# Patient Record
Sex: Male | Born: 1960 | Race: White | Hispanic: No | Marital: Married
Health system: Midwestern US, Community
[De-identification: ages and names within clinical notes are randomized; demographics above are authoritative.]

## PROBLEM LIST (undated history)

## (undated) DIAGNOSIS — Z9989 Dependence on other enabling machines and devices: Secondary | ICD-10-CM

## (undated) DIAGNOSIS — G43909 Migraine, unspecified, not intractable, without status migrainosus: Secondary | ICD-10-CM

## (undated) DIAGNOSIS — R42 Dizziness and giddiness: Secondary | ICD-10-CM

## (undated) DIAGNOSIS — E785 Hyperlipidemia, unspecified: Secondary | ICD-10-CM

## (undated) DIAGNOSIS — G4733 Obstructive sleep apnea (adult) (pediatric): Secondary | ICD-10-CM

## (undated) HISTORY — DX: Dizziness and giddiness: R42

## (undated) HISTORY — PX: ROTATOR CUFF REPAIR: SHX139

## (undated) HISTORY — DX: Obstructive sleep apnea (adult) (pediatric): G47.33

## (undated) HISTORY — DX: Migraine, unspecified, not intractable, without status migrainosus: G43.909

## (undated) HISTORY — PX: WRIST FRACTURE SURGERY: SHX121

## (undated) HISTORY — PX: NASAL SEPTUM SURGERY: SHX37

## (undated) HISTORY — DX: Obstructive sleep apnea (adult) (pediatric): Z99.89

## (undated) HISTORY — DX: Hyperlipidemia, unspecified: E78.5

---

## 1999-02-11 ENCOUNTER — Emergency Department (HOSPITAL_COMMUNITY): Admission: EM | Admit: 1999-02-11 | Discharge: 1999-02-11 | Payer: Self-pay | Admitting: Emergency Medicine

## 2011-02-27 ENCOUNTER — Encounter (HOSPITAL_COMMUNITY)
Admission: RE | Admit: 2011-02-27 | Discharge: 2011-02-27 | Disposition: A | Discharge status: Home / Self Care | Payer: BC Managed Care – PPO | Source: Ambulatory Visit | Attending: Orthopedic Surgery | Admitting: Orthopedic Surgery

## 2011-02-27 LAB — COMPREHENSIVE METABOLIC PANEL
ALT: 31 U/L (ref 0–53)
Alkaline Phosphatase: 72 U/L (ref 39–117)
GFR calc Af Amer: >90 mL/min (ref >90)
Glucose, Bld: 78 mg/dL (ref 70–99)
Potassium: 4.9 mEq/L (ref 3.5–5.1)
Sodium: 139 mEq/L (ref 135–145)
Total Protein: 7.1 g/dL (ref 6.0–8.3)

## 2011-02-27 LAB — CBC
HCT: 47.5 % (ref 39.0–52.0)
Hemoglobin: 16.6 g/dL (ref 13.0–17.0)
MCH: 31.1 pg (ref 26.0–34.0)
MCHC: 34.9 g/dL (ref 30.0–36.0)
MCV: 89.1 fL (ref 78.0–100.0)

## 2011-02-27 LAB — URINALYSIS, ROUTINE W REFLEX MICROSCOPIC
Bilirubin Urine: NEGATIVE
Glucose, UA: NEGATIVE mg/dL
Hgb urine dipstick: NEGATIVE
Ketones, ur: NEGATIVE mg/dL
Protein, ur: NEGATIVE mg/dL

## 2011-02-27 LAB — PROTIME-INR: Prothrombin Time: 14 seconds (ref 11.6–15.2)

## 2011-03-02 ENCOUNTER — Ambulatory Visit (HOSPITAL_COMMUNITY)
Admission: RE | Admit: 2011-03-02 | Discharge: 2011-03-02 | Disposition: A | Discharge status: Home / Self Care | Payer: BC Managed Care – PPO | Source: Ambulatory Visit | Attending: Orthopedic Surgery | Admitting: Orthopedic Surgery

## 2011-03-02 DIAGNOSIS — F3289 Other specified depressive episodes: Secondary | ICD-10-CM | POA: Insufficient documentation

## 2011-03-02 DIAGNOSIS — G4733 Obstructive sleep apnea (adult) (pediatric): Secondary | ICD-10-CM | POA: Insufficient documentation

## 2011-03-02 DIAGNOSIS — M25819 Other specified joint disorders, unspecified shoulder: Secondary | ICD-10-CM | POA: Insufficient documentation

## 2011-03-02 DIAGNOSIS — F329 Major depressive disorder, single episode, unspecified: Secondary | ICD-10-CM | POA: Insufficient documentation

## 2011-03-02 DIAGNOSIS — M719 Bursopathy, unspecified: Secondary | ICD-10-CM | POA: Insufficient documentation

## 2011-03-02 DIAGNOSIS — M19019 Primary osteoarthritis, unspecified shoulder: Secondary | ICD-10-CM | POA: Insufficient documentation

## 2011-03-02 DIAGNOSIS — Z01812 Encounter for preprocedural laboratory examination: Secondary | ICD-10-CM | POA: Insufficient documentation

## 2011-03-02 DIAGNOSIS — M67919 Unspecified disorder of synovium and tendon, unspecified shoulder: Secondary | ICD-10-CM | POA: Insufficient documentation

## 2011-03-07 NOTE — Op Note (Signed)
NAMELASHAUN, KRAPF NO.:  [id number]  MEDICAL RECORD NO.:  [id number]  LOCATION:  [facility]                         FACILITY:  [facility]  PHYSICIAN:  Vania Rea. Jaishon Krisher, M.D.  DATE OF BIRTH:  06/25/1960  DATE OF PROCEDURE:  03/02/2011 DATE OF DISCHARGE:                              OPERATIVE REPORT   PREOPERATIVE DIAGNOSES: 1. Chronic left shoulder impingement with subacromial bursitis and     radiographic evidence for calcific tendonitis. 2. Left shoulder acromioclavicular arthropathy.  POSTOPERATIVE DIAGNOSIS: 1. Chronic left shoulder impingement syndrome. 2. Left shoulder acromioclavicular joint arthrosis. 3. Left shoulder labral tear.  PROCEDURE: 1. Left shoulder examination under anesthesia include left shoulder     diagnostic arthroscopy. 2. Debridement of superior labral tear. 3. Arthroscopic subacromial decompression bursectomy. 4. Arthroscopic distal clavicle resection.  SURGEON:  Vania Rea. Carle Dargan, M.D.ASSISTANT:  Lucita Lora. Shuford, P.A.-C.  ANESTHESIA:  General endotracheal as well as an interscalene block.  BLOOD LOSS:  Minimal.  HISTORY:  Mr. Wehner is a 49-year gentleman who has had chronic and progressive increasing left shoulder pain with impingement syndrome and radiographs showed evidence for focal calcification within the subacromial bursa.  Due to his ongoing pain and functional limitations, he is brought to the operating room at this time for planned left shoulder arthroscopy as described below.  We have counseled Mr. Norton on treatment options as well as risks versus benefits thereof.  Possible surgical complications were reviewed including potential for bleeding, infection, neurovascular injury, persistent loss of motion, anesthetic complication, possible need for additional surgery.  He understands and accepts and agrees planned procedure.  PROCEDURE IN DETAIL:  After undergoing routine preop evaluation, the patient received  prophylactic antibiotics, and an interscalene block was established in the holding area by the Anesthesia Department.  Placed supine on the operating table and underwent induction of general endotracheal anesthesia.  Return to the right lateral decubitus position on a beanbag and appropriately padded and protected .  Left shoulder examination revealed essentially full motion with some mild tightness at end range of passive forward elevation, achieving 160 degrees, but did not appreciate any obvious palpable or audible release of adhesions. Left arm suspended to 70 degrees abduction with 15 pounds traction. Left shoulder region was sterilely prepped and draped in standard fashion.  Time-out was called.  Placed forceps in glenohumeral joint and portal established under direct visualization.  Glenohumeral debris surfaces were all in excellent condition.  There was a pedunculated tear of the posterior superior labrum which was debrided with the shaver and additional debridement of the superior labrum diffusely consistent with a type 1 SLAP lesion.  The biceps anchor was stable.  No instability pattern was noted.  The biceps was normal caliber distally with no evidence for distal instability.  Rotator cuff was carefully inspected and found be intact without obvious degeneration of fraying.  At this point, fluid and instruments were then removed from the glenohumeral joint.  The arm was dropped down to 30 degrees of abduction with arthroscope in the subacromial space at the posterior portal in a direct lateral portal established the subacromial space.  Abundant fluid bursal tissue, multiple adhesions were encountered.  The previous MRI scan had suggested there was a focal area of calcification within the subdeltoid bursal region and performed a complete bursectomy and I did not see any obvious discrete hard, calcified mass, but there was very dense bursal tissue throughout the  subacromial/subdeltoid region.  We performed a complete bursectomy.  We then using the Stryker wand to remove the periosteum from the undersurface of the anterior half of the acromion, and the subacromial depression was performed with bur creating type 1 morphology.  Portal was then established directly anterior to the distal clavicle and distal clavicle section performing the bur.  Care was taken to confirm visualization of the entire circumference of the distal clavicle to ensure adequate removal of bone.  We then completed subacromial/subdeltoid bursectomy.  We then used a needle to make multiple trepanations through the distal supraspinatus area where we had some concerns about the possibility of an intratendinous calcific deposit and did not see any obvious chalky calcified material emanating from the distal rotator cuff, aggressive probing in this area also did not show any obvious defects, and there were certainly no palpable areas of firm calcification.  At this point, we then completed bursectomy. Final hemostasis was obtained.  Fluid and instruments were removed. Portals closed with Monocryl and Steri-Strips.  A bulky dry dressing was taped at the left shoulder and the left arm placed in a sling immobilizer.  The patient was awakened, extubated, taken recovery room in stable condition.     Vania Rea. Reighlyn Elmes, M.D.     KMS/MEDQ  D:  03/02/2011  T:  03/02/2011  Job:  [id number]  Electronically Signed by Francena Hanly M.D. on 03/07/2011 06:46:45 PM

## 2011-08-28 HISTORY — PX: WRIST FRACTURE SURGERY: SHX121

## 2011-09-17 ENCOUNTER — Emergency Department (INDEPENDENT_AMBULATORY_CARE_PROVIDER_SITE_OTHER)
Admission: EM | Admit: 2011-09-17 | Discharge: 2011-09-17 | Disposition: A | Discharge status: Home / Self Care | Payer: BC Managed Care – PPO | Source: Home / Self Care | Attending: Family Medicine | Admitting: Family Medicine

## 2011-09-17 ENCOUNTER — Emergency Department (INDEPENDENT_AMBULATORY_CARE_PROVIDER_SITE_OTHER): Payer: BC Managed Care – PPO

## 2011-09-17 ENCOUNTER — Encounter (HOSPITAL_COMMUNITY): Payer: Self-pay | Admitting: *Deleted

## 2011-09-17 DIAGNOSIS — S62101A Fracture of unspecified carpal bone, right wrist, initial encounter for closed fracture: Secondary | ICD-10-CM

## 2011-09-17 DIAGNOSIS — S62109A Fracture of unspecified carpal bone, unspecified wrist, initial encounter for closed fracture: Secondary | ICD-10-CM

## 2011-09-17 MED ORDER — HYDROCODONE-ACETAMINOPHEN 5-325 MG PO TABS: 1.0000 | ORAL_TABLET | Freq: Four times a day (QID) | ORAL | Status: DC | PRN

## 2011-09-17 NOTE — ED Notes (Signed)
Pt participated in rugged [employer] run 5K yesterday climbing over obstacle approx 6 ft fell now with increased pain right wrist swelling - increased pain with minimal movement fingers /hand -

## 2011-09-17 NOTE — Discharge Instructions (Signed)
See dr Melvyn Novas on mon at 1pm for further discussion about wrist fracture.

## 2011-09-17 NOTE — ED Provider Notes (Signed)
History     CSN: [id number]  Arrival date & time 09/17/11  1256   First MD Initiated Contact with Patient 09/17/11 1316      Chief Complaint  Patient presents with  . Wrist Pain  . Wrist Injury    (Consider location/radiation/quality/duration/timing/severity/associated sxs/prior treatment) Patient is a 51 y.o. male presenting with wrist pain and wrist injury. The history is provided by the patient.  Wrist Pain This is a new problem. The current episode started yesterday (fell during obstacle run off 6 ft wall onto right wrist, with pain and swelling since, no other injury, head, neck back and lower ext intact.). The problem has been gradually worsening. Pertinent negatives include no chest pain and no abdominal pain.  Wrist Injury     History reviewed. No pertinent past medical history.  History reviewed. No pertinent past surgical history.  History reviewed. No pertinent family history.  History  Substance Use Topics  . Smoking status: Never Smoker   . Smokeless tobacco: Not on file  . Alcohol Use: No      Review of Systems  Constitutional: Negative.   Cardiovascular: Negative for chest pain.  Gastrointestinal: Negative for abdominal pain.  Musculoskeletal: Positive for joint swelling. Negative for back pain.  Skin: Negative.     Allergies  Review of patient's allergies indicates no known allergies.  Home Medications   Current Outpatient Rx  Name Route Sig Dispense Refill  . HYDROCODONE-ACETAMINOPHEN 5-325 MG PO TABS Oral Take 1 tablet by mouth every 6 (six) hours as needed for pain. 12 tablet 0    BP 127/72  Pulse 78  Temp(Src) 98.5 F (36.9 C) (Oral)  Resp 18  SpO2 100%  Physical Exam  Nursing note and vitals reviewed. Constitutional: He is oriented to person, place, and time. He appears well-developed and well-nourished.  HENT:  Head: Normocephalic and atraumatic.  Eyes: Pupils are equal, round, and reactive to light.  Neck: Normal range of  motion. Neck supple.  Pulmonary/Chest: He exhibits no tenderness.  Musculoskeletal: He exhibits tenderness.       Right wrist: He exhibits decreased range of motion, tenderness and swelling.  Neurological: He is alert and oriented to person, place, and time.  Skin: Skin is warm and dry.    ED Course  Procedures (including critical care time)  Labs Reviewed - No data to display Dg Wrist Complete Right  09/17/2011  *RADIOLOGY REPORT*  Clinical Data: Fall, pain.  RIGHT WRIST - COMPLETE 3+ VIEW  Comparison: None.  Findings: The patient has a mildly impacted and comminuted distal radius fracture with intra-articular extension.  Ulnar styloid fracture also noted.  Soft tissue swelling is identified.  IMPRESSION: Distal radius and ulnar styloid fractures as above.  Original Report Authenticated By: Bernadene Bell. D'ALESSIO, M.D.     1. Fracture of right wrist       MDM  X-rays reviewed and report per radiologist.  discussed with dr Melvyn Novas, treatment as advised.         Linna Hoff, MD 09/17/11 [phone]

## 2011-09-17 NOTE — Progress Notes (Signed)
Orthopedic Tech Progress Note Patient Details:  Andrew Rubio 10-06-1960 [id number]  Other Ortho Devices Type of Ortho Device: Other (comment) (arm sling foam ) Ortho Device Location: (R) UE Ortho Device Interventions: Application  Type of Splint: Sugartong Splint Location: (R) UE Splint Interventions: Application    [name] 09/17/2011, 3:29 PM

## 2011-09-18 ENCOUNTER — Ambulatory Visit
Admission: RE | Admit: 2011-09-18 | Discharge: 2011-09-18 | Disposition: A | Discharge status: Home / Self Care | Payer: BC Managed Care – PPO | Source: Ambulatory Visit | Attending: Orthopedic Surgery | Admitting: Orthopedic Surgery

## 2011-09-18 ENCOUNTER — Other Ambulatory Visit: Payer: Self-pay | Admitting: Orthopedic Surgery

## 2011-09-18 DIAGNOSIS — S52509A Unspecified fracture of the lower end of unspecified radius, initial encounter for closed fracture: Secondary | ICD-10-CM

## 2011-09-21 ENCOUNTER — Encounter (HOSPITAL_COMMUNITY): Payer: Self-pay | Admitting: Emergency Medicine

## 2011-09-21 ENCOUNTER — Emergency Department (HOSPITAL_COMMUNITY)
Admission: EM | Admit: 2011-09-21 | Discharge: 2011-09-21 | Disposition: A | Discharge status: Home / Self Care | Payer: BC Managed Care – PPO | Attending: Emergency Medicine | Admitting: Emergency Medicine

## 2011-09-21 DIAGNOSIS — M79609 Pain in unspecified limb: Secondary | ICD-10-CM

## 2011-09-21 DIAGNOSIS — Z79899 Other long term (current) drug therapy: Secondary | ICD-10-CM | POA: Insufficient documentation

## 2011-09-21 DIAGNOSIS — G8918 Other acute postprocedural pain: Secondary | ICD-10-CM | POA: Insufficient documentation

## 2011-09-21 MED ORDER — ONDANSETRON HCL 4 MG/2ML IJ SOLN
4.0000 mg | Freq: Once | INTRAMUSCULAR | Status: AC
  Administered 2011-09-21: 4 mg via INTRAVENOUS
  Filled 2011-09-21: qty 2

## 2011-09-21 MED ORDER — HYDROMORPHONE HCL PF 1 MG/ML IJ SOLN
1.0000 mg | Freq: Once | INTRAMUSCULAR | Status: AC
  Administered 2011-09-21: 1 mg via INTRAVENOUS
  Filled 2011-09-21: qty 1

## 2011-09-21 MED ORDER — HYDROMORPHONE HCL PF 1 MG/ML IJ SOLN
1.0000 mg | Freq: Once | INTRAMUSCULAR | Status: AC
Start: 2011-09-21 — End: 2011-09-21
  Administered 2011-09-21: 1 mg via INTRAVENOUS
  Filled 2011-09-21: qty 1

## 2011-09-21 NOTE — ED Provider Notes (Signed)
History     CSN: [id number]  Arrival date & time 09/21/11  6433   First MD Initiated Contact with Patient 09/21/11 [phone]      Chief Complaint  Patient presents with  . Arm Pain    (Consider location/radiation/quality/duration/timing/severity/associated sxs/prior treatment) Patient is a 51 y.o. male presenting with arm pain. The history is provided by the patient.  Arm Pain Pertinent negatives include no chest pain, no abdominal pain, no headaches and no shortness of breath.   Rt. Wrist/ arm pain, fell 5 days ago and fractured his right wrist requiring surgery yesterday morning. Patient went home with a splint in place and pain medications. He states that after the anesthesia wore off,  He developed severe pain unrelieved by medications at home. No numbness or coldness in his fingers. He does have postoperative swelling. No fall or new trauma. No fevers or chills. Some nausea no vomiting. Sharp in quality and not radiating from his right wrist. Followed by Dr. Orlan Leavens  History reviewed. No pertinent past medical history.  Past Surgical History  Procedure Date  . Rotator cuff repair   . Wrist fracture surgery     No family history on file.  History  Substance Use Topics  . Smoking status: Never Smoker   . Smokeless tobacco: Not on file  . Alcohol Use: No      Review of Systems  Constitutional: Negative for fever and chills.  HENT: Negative for neck pain and neck stiffness.   Eyes: Negative for pain.  Respiratory: Negative for shortness of breath.   Cardiovascular: Negative for chest pain.  Gastrointestinal: Negative for abdominal pain.  Genitourinary: Negative for dysuria.  Musculoskeletal: Negative for back pain.  Skin: Negative for rash.  Neurological: Negative for headaches.  All other systems reviewed and are negative.    Allergies  Review of patient's allergies indicates no known allergies.  Home Medications   Current Outpatient Rx  Name Route Sig Dispense  Refill  . DOCUSATE SODIUM 100 MG PO CAPS Oral Take 100 mg by mouth 2 (two) times daily.    Marland Kitchen METHOCARBAMOL 500 MG PO TABS Oral Take 500 mg by mouth 4 (four) times daily. spasms    . OXYCODONE-ACETAMINOPHEN 10-325 MG PO TABS Oral Take 1-2 tablets by mouth every 4 (four) hours as needed. pain    . VITAMIN B-6 100 MG PO TABS Oral Take 100 mg by mouth daily.    Marland Kitchen VITAMIN C 500 MG PO TABS Oral Take 500 mg by mouth daily.      BP 130/70  Pulse 72  Temp(Src) 97.6 F (36.4 C) (Oral)  Resp 16  SpO2 97%  Physical Exam  Constitutional: He is oriented to person, place, and time. He appears well-developed and well-nourished.  HENT:  Head: Normocephalic and atraumatic.  Eyes: Conjunctivae and EOM are normal. Pupils are equal, round, and reactive to light.  Neck: Trachea normal. Neck supple. No thyromegaly present.  Cardiovascular: Normal rate, regular rhythm, S1 normal, S2 normal and normal pulses.     No systolic murmur is present   No diastolic murmur is present  Pulses:      Radial pulses are 2+ on the right side, and 2+ on the left side.  Pulmonary/Chest: Effort normal and breath sounds normal. He has no wheezes. He has no rhonchi. He has no rales. He exhibits no tenderness.  Abdominal: Soft. Normal appearance and bowel sounds are normal. There is no tenderness. There is no CVA tenderness and negative Murphy's sign.  Musculoskeletal:       Right upper extremity: Splint in place with postoperative swelling apparent and digits. Hand and fingers are warm and well-perfused with good capillary refill. Sensorium to light touch intact.   Neurological: He is alert and oriented to person, place, and time. He has normal strength. No cranial nerve deficit or sensory deficit. GCS eye subscore is 4. GCS verbal subscore is 5. GCS motor subscore is 6.  Skin: Skin is warm and dry. No rash noted. He is not diaphoretic.  Psychiatric: His speech is normal.       Cooperative and appropriate    ED Course    Procedures (including critical care time)  IV Dilaudid x2.   MDM   Postoperative right wrist pain improved with IV dilaudid.  No evidence of vascular compromise by exam. Stable for discharge home with outpatient follow up. He has pain medications at home. Reliable historian verbalizes understanding discharge and followup instructions, in addition to, strict return precautions for any worsening or concerning symptoms.        Sunnie Nielsen, MD 09/21/11 [phone]

## 2011-09-21 NOTE — Discharge Instructions (Signed)
Followup with Dr. Lovie Macadamia is scheduled and take medications as prescribed. Keep your right arm elevated and use ice as directed.  Pain Relief Preoperatively and Postoperatively  Being a good patient does not mean being a silent one. If you have questions, problems, or concerns about the pain you may feel after surgery, let your caregiver know. Patients have the right to assessment and management of pain. The treatment of pain after surgery is important to speed up recovery and return to normal activities. Severe pain after surgery, and the fear or anxiety associated with that pain, may cause extreme discomfort that:  Prevents sleep.  Decreases the ability to breathe deeply and cough. This can cause pneumonia or other upper airway infections.  Causes your heart to beat faster and your blood pressure to be higher.  Increases the risk for constipation and bloating.  Decreases the ability of wounds to heal.  May result in depression, increased anxiety, and feelings of helplessness.  Relief of pain before surgery is also important because it will lessen the pain after surgery. Patients who receive both pain relief before and after surgery experience greater pain relief than those who only receive pain relief after surgery. Let your caregiver know if you are having uncontrolled pain. This is very important. Pain after surgery is more difficult to manage if it is permitted to become severe, so prompt and adequate treatment of acute pain is necessary.  PAIN CONTROL METHODS  Your caregivers follow policies and procedures about the management of patient pain. These guidelines should be explained to you before surgery. Plans for pain control after surgery must be mutually decided upon and instituted with your full understanding and agreement. Do not be afraid to ask questions regarding the care you are receiving. There are many different ways your caregivers will attempt to control your pain, including the following  methods.  As needed pain control  You may be given pain medicine either through your intravenous (IV) tube, or as a pill or liquid you can swallow. You will need to let your caregiver know when you are having pain. Then, your caregiver will give you the pain medicine ordered for you.  Your pain medicine may make you constipated. If constipation occurs, drink more liquids if you can. Your caregiver may have you take a mild laxative.  IV patient-controlled analgesia pump (PCA pump)  You can get your pain medicine through the IV tube which goes into your vein. You are able to control the amount of pain medicine that you get. The pain medicine flows in through an IV tube and is controlled by a pump. This pump gives you a set amount of pain medicine when you push the button hooked up to it. Nobody should push this button but you or someone specifically assigned by you to do so. It is set up to keep you from accidentally giving yourself too much pain medicine. You will be able to start using your pain pump in the recovery room after your surgery. This method can be helpful for most types of surgery.  If you are still having too much pain, tell your caregiver. Also, tell your caregiver if you are feeling too sleepy or nauseous.  Continuous epidural pain control  A thin, soft tube (catheter) is put into your back. Pain medicine flows through the catheter to lessen pain in the part of your body where the surgery is done. Continuous epidural pain control may work best for you if you are having surgery  on your chest, abdomen, hip area, or legs. The epidural catheter is usually put into your back just before surgery. The catheter is left in until you can eat and take medicine by mouth. In most cases, this may take 2 to 3 days.  Giving pain medicine through the epidural catheter may help you heal faster because:  Your bowel gets back to normal faster.  You can get back to eating sooner.  You can be up and walking  sooner.  Medicine that numbs the area (local anesthetic)  You may receive an injection of pain medicine near where the pain is (local infiltration).  You may receive an injection of pain medicine near the nerve that controls the sensation to a specific part of the body (peripheral nerve block).  Medicine may be put in the spine to block pain (spinal block).  Opioids  Moderate to moderately severe acute pain after surgery may respond to opioids. Opioids are narcotic pain medicine. Opioids are often combined with non-narcotic medicines to improve pain relief, diminish the risk of side effects, and reduce the chance of addiction.  If you follow your caregiver's directions about taking opioids and you do not have a history of substance abuse, your risk of becoming addicted is exceptionally small. Opioids are given for short periods of time in careful doses to prevent addiction.  Other methods of pain control include:  Steroids.  Physical therapy.  Heat and cold therapy.  Compression, such as wrapping an elastic bandage around the area of pain.  Massage.  These various ways of controlling pain may be used together. Combining different methods of pain control is called multimodal analgesia. Using this approach has many benefits, including being able to eat, move around, and leave the hospital sooner.

## 2011-09-21 NOTE — ED Notes (Signed)
PT. REPORTS PAIN AT RIGHT ARM THIS EVENING UNRELIEVED BY PRESCRIPTION PERCOCET AND ROBAXIN - S/P RIGHT WRIST SURGERY BY DR. Dennison Mascot .

## 2011-09-21 NOTE — ED Notes (Signed)
PT states understanding of discharge instructions 

## 2014-07-27 ENCOUNTER — Other Ambulatory Visit: Payer: Self-pay | Admitting: Internal Medicine

## 2014-07-27 DIAGNOSIS — M5416 Radiculopathy, lumbar region: Secondary | ICD-10-CM

## 2014-08-03 ENCOUNTER — Other Ambulatory Visit: Payer: Self-pay

## 2015-09-04 DIAGNOSIS — M62830 Muscle spasm of back: Secondary | ICD-10-CM | POA: Diagnosis not present

## 2015-09-06 DIAGNOSIS — E291 Testicular hypofunction: Secondary | ICD-10-CM | POA: Diagnosis not present

## 2015-09-20 DIAGNOSIS — E291 Testicular hypofunction: Secondary | ICD-10-CM | POA: Diagnosis not present

## 2015-09-21 DIAGNOSIS — F411 Generalized anxiety disorder: Secondary | ICD-10-CM | POA: Diagnosis not present

## 2015-09-29 DIAGNOSIS — F411 Generalized anxiety disorder: Secondary | ICD-10-CM | POA: Diagnosis not present

## 2015-10-07 DIAGNOSIS — F411 Generalized anxiety disorder: Secondary | ICD-10-CM | POA: Diagnosis not present

## 2015-10-14 DIAGNOSIS — F411 Generalized anxiety disorder: Secondary | ICD-10-CM | POA: Diagnosis not present

## 2015-10-21 DIAGNOSIS — F411 Generalized anxiety disorder: Secondary | ICD-10-CM | POA: Diagnosis not present

## 2015-10-28 DIAGNOSIS — F411 Generalized anxiety disorder: Secondary | ICD-10-CM | POA: Diagnosis not present

## 2015-11-10 DIAGNOSIS — F411 Generalized anxiety disorder: Secondary | ICD-10-CM | POA: Diagnosis not present

## 2015-11-23 DIAGNOSIS — F411 Generalized anxiety disorder: Secondary | ICD-10-CM | POA: Diagnosis not present

## 2016-02-23 DIAGNOSIS — G4733 Obstructive sleep apnea (adult) (pediatric): Secondary | ICD-10-CM | POA: Diagnosis not present

## 2016-03-18 DIAGNOSIS — G4733 Obstructive sleep apnea (adult) (pediatric): Secondary | ICD-10-CM | POA: Diagnosis not present

## 2016-03-24 DIAGNOSIS — E291 Testicular hypofunction: Secondary | ICD-10-CM | POA: Diagnosis not present

## 2016-03-31 DIAGNOSIS — H52223 Regular astigmatism, bilateral: Secondary | ICD-10-CM | POA: Diagnosis not present

## 2016-03-31 DIAGNOSIS — H524 Presbyopia: Secondary | ICD-10-CM | POA: Diagnosis not present

## 2016-03-31 DIAGNOSIS — M546 Pain in thoracic spine: Secondary | ICD-10-CM | POA: Diagnosis not present

## 2016-04-03 ENCOUNTER — Other Ambulatory Visit: Payer: Self-pay | Admitting: Internal Medicine

## 2016-04-03 DIAGNOSIS — M5412 Radiculopathy, cervical region: Secondary | ICD-10-CM

## 2016-04-07 ENCOUNTER — Ambulatory Visit
Admission: RE | Admit: 2016-04-07 | Discharge: 2016-04-07 | Disposition: A | Discharge status: Home / Self Care | Payer: BLUE CROSS/BLUE SHIELD | Source: Ambulatory Visit | Attending: Internal Medicine | Admitting: Internal Medicine

## 2016-04-07 ENCOUNTER — Other Ambulatory Visit: Payer: Self-pay | Admitting: Internal Medicine

## 2016-04-07 DIAGNOSIS — M546 Pain in thoracic spine: Secondary | ICD-10-CM

## 2016-04-07 DIAGNOSIS — M542 Cervicalgia: Secondary | ICD-10-CM | POA: Diagnosis not present

## 2016-04-11 DIAGNOSIS — I10 Essential (primary) hypertension: Secondary | ICD-10-CM | POA: Diagnosis not present

## 2016-04-11 DIAGNOSIS — E559 Vitamin D deficiency, unspecified: Secondary | ICD-10-CM | POA: Diagnosis not present

## 2016-04-11 DIAGNOSIS — G4730 Sleep apnea, unspecified: Secondary | ICD-10-CM | POA: Diagnosis not present

## 2016-04-11 DIAGNOSIS — Z79899 Other long term (current) drug therapy: Secondary | ICD-10-CM | POA: Diagnosis not present

## 2016-04-11 DIAGNOSIS — Z125 Encounter for screening for malignant neoplasm of prostate: Secondary | ICD-10-CM | POA: Diagnosis not present

## 2016-04-11 DIAGNOSIS — M5412 Radiculopathy, cervical region: Secondary | ICD-10-CM | POA: Diagnosis not present

## 2016-04-11 DIAGNOSIS — Z0000 Encounter for general adult medical examination without abnormal findings: Secondary | ICD-10-CM | POA: Diagnosis not present

## 2016-04-11 DIAGNOSIS — E291 Testicular hypofunction: Secondary | ICD-10-CM | POA: Diagnosis not present

## 2016-04-11 DIAGNOSIS — N528 Other male erectile dysfunction: Secondary | ICD-10-CM | POA: Diagnosis not present

## 2016-04-13 ENCOUNTER — Other Ambulatory Visit: Payer: Self-pay

## 2016-04-26 DIAGNOSIS — Z23 Encounter for immunization: Secondary | ICD-10-CM | POA: Diagnosis not present

## 2016-04-26 DIAGNOSIS — E291 Testicular hypofunction: Secondary | ICD-10-CM | POA: Diagnosis not present

## 2016-05-17 DIAGNOSIS — E291 Testicular hypofunction: Secondary | ICD-10-CM | POA: Diagnosis not present

## 2016-06-05 DIAGNOSIS — G4733 Obstructive sleep apnea (adult) (pediatric): Secondary | ICD-10-CM | POA: Diagnosis not present

## 2016-08-08 DIAGNOSIS — E291 Testicular hypofunction: Secondary | ICD-10-CM | POA: Diagnosis not present

## 2016-08-22 DIAGNOSIS — E291 Testicular hypofunction: Secondary | ICD-10-CM | POA: Diagnosis not present

## 2016-09-05 DIAGNOSIS — E291 Testicular hypofunction: Secondary | ICD-10-CM | POA: Diagnosis not present

## 2016-09-19 DIAGNOSIS — E291 Testicular hypofunction: Secondary | ICD-10-CM | POA: Diagnosis not present

## 2016-10-17 DIAGNOSIS — E291 Testicular hypofunction: Secondary | ICD-10-CM | POA: Diagnosis not present

## 2016-10-31 DIAGNOSIS — E291 Testicular hypofunction: Secondary | ICD-10-CM | POA: Diagnosis not present

## 2016-11-21 DIAGNOSIS — E291 Testicular hypofunction: Secondary | ICD-10-CM | POA: Diagnosis not present

## 2016-12-05 DIAGNOSIS — E291 Testicular hypofunction: Secondary | ICD-10-CM | POA: Diagnosis not present

## 2016-12-20 DIAGNOSIS — E291 Testicular hypofunction: Secondary | ICD-10-CM | POA: Diagnosis not present

## 2017-01-03 DIAGNOSIS — E291 Testicular hypofunction: Secondary | ICD-10-CM | POA: Diagnosis not present

## 2017-01-17 DIAGNOSIS — I889 Nonspecific lymphadenitis, unspecified: Secondary | ICD-10-CM | POA: Diagnosis not present

## 2017-01-17 DIAGNOSIS — E291 Testicular hypofunction: Secondary | ICD-10-CM | POA: Diagnosis not present

## 2017-01-25 DIAGNOSIS — D492 Neoplasm of unspecified behavior of bone, soft tissue, and skin: Secondary | ICD-10-CM | POA: Diagnosis not present

## 2017-01-31 DIAGNOSIS — E291 Testicular hypofunction: Secondary | ICD-10-CM | POA: Diagnosis not present

## 2017-02-14 DIAGNOSIS — K649 Unspecified hemorrhoids: Secondary | ICD-10-CM | POA: Diagnosis not present

## 2017-02-14 DIAGNOSIS — G4733 Obstructive sleep apnea (adult) (pediatric): Secondary | ICD-10-CM | POA: Diagnosis not present

## 2017-02-14 DIAGNOSIS — N529 Male erectile dysfunction, unspecified: Secondary | ICD-10-CM | POA: Diagnosis not present

## 2017-02-14 DIAGNOSIS — R21 Rash and other nonspecific skin eruption: Secondary | ICD-10-CM | POA: Diagnosis not present

## 2017-02-14 DIAGNOSIS — E291 Testicular hypofunction: Secondary | ICD-10-CM | POA: Diagnosis not present

## 2017-02-14 DIAGNOSIS — Z23 Encounter for immunization: Secondary | ICD-10-CM | POA: Diagnosis not present

## 2017-02-26 DIAGNOSIS — N5201 Erectile dysfunction due to arterial insufficiency: Secondary | ICD-10-CM | POA: Diagnosis not present

## 2017-02-26 DIAGNOSIS — N481 Balanitis: Secondary | ICD-10-CM | POA: Diagnosis not present

## 2017-03-28 DIAGNOSIS — M79629 Pain in unspecified upper arm: Secondary | ICD-10-CM | POA: Diagnosis not present

## 2017-03-28 DIAGNOSIS — E291 Testicular hypofunction: Secondary | ICD-10-CM | POA: Diagnosis not present

## 2017-04-09 DIAGNOSIS — H35071 Retinal telangiectasis, right eye: Secondary | ICD-10-CM | POA: Diagnosis not present

## 2017-04-09 DIAGNOSIS — H2513 Age-related nuclear cataract, bilateral: Secondary | ICD-10-CM | POA: Diagnosis not present

## 2017-04-09 DIAGNOSIS — H04123 Dry eye syndrome of bilateral lacrimal glands: Secondary | ICD-10-CM | POA: Diagnosis not present

## 2017-04-11 DIAGNOSIS — E291 Testicular hypofunction: Secondary | ICD-10-CM | POA: Diagnosis not present

## 2017-04-23 DIAGNOSIS — H35073 Retinal telangiectasis, bilateral: Secondary | ICD-10-CM | POA: Diagnosis not present

## 2017-06-11 DIAGNOSIS — E291 Testicular hypofunction: Secondary | ICD-10-CM | POA: Diagnosis not present

## 2017-06-11 DIAGNOSIS — G4733 Obstructive sleep apnea (adult) (pediatric): Secondary | ICD-10-CM | POA: Diagnosis not present

## 2017-06-14 DIAGNOSIS — F900 Attention-deficit hyperactivity disorder, predominantly inattentive type: Secondary | ICD-10-CM | POA: Diagnosis not present

## 2017-06-14 DIAGNOSIS — F4325 Adjustment disorder with mixed disturbance of emotions and conduct: Secondary | ICD-10-CM | POA: Diagnosis not present

## 2017-06-25 DIAGNOSIS — E291 Testicular hypofunction: Secondary | ICD-10-CM | POA: Diagnosis not present

## 2017-07-02 DIAGNOSIS — G4733 Obstructive sleep apnea (adult) (pediatric): Secondary | ICD-10-CM | POA: Diagnosis not present

## 2017-07-10 DIAGNOSIS — F411 Generalized anxiety disorder: Secondary | ICD-10-CM | POA: Diagnosis not present

## 2017-07-10 DIAGNOSIS — F429 Obsessive-compulsive disorder, unspecified: Secondary | ICD-10-CM | POA: Diagnosis not present

## 2017-07-11 DIAGNOSIS — E291 Testicular hypofunction: Secondary | ICD-10-CM | POA: Diagnosis not present

## 2017-08-08 DIAGNOSIS — F411 Generalized anxiety disorder: Secondary | ICD-10-CM | POA: Diagnosis not present

## 2017-09-11 DIAGNOSIS — E291 Testicular hypofunction: Secondary | ICD-10-CM | POA: Diagnosis not present

## 2017-09-11 DIAGNOSIS — N529 Male erectile dysfunction, unspecified: Secondary | ICD-10-CM | POA: Diagnosis not present

## 2017-10-10 DIAGNOSIS — F411 Generalized anxiety disorder: Secondary | ICD-10-CM | POA: Diagnosis not present

## 2017-11-05 DIAGNOSIS — E291 Testicular hypofunction: Secondary | ICD-10-CM | POA: Diagnosis not present

## 2017-11-05 DIAGNOSIS — I10 Essential (primary) hypertension: Secondary | ICD-10-CM | POA: Diagnosis not present

## 2017-11-05 DIAGNOSIS — G4730 Sleep apnea, unspecified: Secondary | ICD-10-CM | POA: Diagnosis not present

## 2017-11-05 DIAGNOSIS — E6609 Other obesity due to excess calories: Secondary | ICD-10-CM | POA: Diagnosis not present

## 2017-12-05 DIAGNOSIS — E291 Testicular hypofunction: Secondary | ICD-10-CM | POA: Diagnosis not present

## 2017-12-17 DIAGNOSIS — E291 Testicular hypofunction: Secondary | ICD-10-CM | POA: Diagnosis not present

## 2017-12-19 DIAGNOSIS — F411 Generalized anxiety disorder: Secondary | ICD-10-CM | POA: Diagnosis not present

## 2018-01-14 DIAGNOSIS — E291 Testicular hypofunction: Secondary | ICD-10-CM | POA: Diagnosis not present

## 2018-01-23 DIAGNOSIS — F900 Attention-deficit hyperactivity disorder, predominantly inattentive type: Secondary | ICD-10-CM | POA: Diagnosis not present

## 2018-01-23 DIAGNOSIS — F4325 Adjustment disorder with mixed disturbance of emotions and conduct: Secondary | ICD-10-CM | POA: Diagnosis not present

## 2018-02-06 DIAGNOSIS — F4325 Adjustment disorder with mixed disturbance of emotions and conduct: Secondary | ICD-10-CM | POA: Diagnosis not present

## 2018-02-06 DIAGNOSIS — F900 Attention-deficit hyperactivity disorder, predominantly inattentive type: Secondary | ICD-10-CM | POA: Diagnosis not present

## 2018-02-12 DIAGNOSIS — N529 Male erectile dysfunction, unspecified: Secondary | ICD-10-CM | POA: Diagnosis not present

## 2018-02-13 DIAGNOSIS — F900 Attention-deficit hyperactivity disorder, predominantly inattentive type: Secondary | ICD-10-CM | POA: Diagnosis not present

## 2018-02-13 DIAGNOSIS — F4325 Adjustment disorder with mixed disturbance of emotions and conduct: Secondary | ICD-10-CM | POA: Diagnosis not present

## 2018-02-20 DIAGNOSIS — F4325 Adjustment disorder with mixed disturbance of emotions and conduct: Secondary | ICD-10-CM | POA: Diagnosis not present

## 2018-02-20 DIAGNOSIS — F900 Attention-deficit hyperactivity disorder, predominantly inattentive type: Secondary | ICD-10-CM | POA: Diagnosis not present

## 2018-02-26 DIAGNOSIS — Z23 Encounter for immunization: Secondary | ICD-10-CM | POA: Diagnosis not present

## 2018-02-28 DIAGNOSIS — F4325 Adjustment disorder with mixed disturbance of emotions and conduct: Secondary | ICD-10-CM | POA: Diagnosis not present

## 2018-02-28 DIAGNOSIS — F900 Attention-deficit hyperactivity disorder, predominantly inattentive type: Secondary | ICD-10-CM | POA: Diagnosis not present

## 2018-03-12 DIAGNOSIS — E291 Testicular hypofunction: Secondary | ICD-10-CM | POA: Diagnosis not present

## 2018-03-13 DIAGNOSIS — F900 Attention-deficit hyperactivity disorder, predominantly inattentive type: Secondary | ICD-10-CM | POA: Diagnosis not present

## 2018-03-13 DIAGNOSIS — F4325 Adjustment disorder with mixed disturbance of emotions and conduct: Secondary | ICD-10-CM | POA: Diagnosis not present

## 2018-03-14 DIAGNOSIS — F331 Major depressive disorder, recurrent, moderate: Secondary | ICD-10-CM | POA: Diagnosis not present

## 2018-03-14 DIAGNOSIS — F411 Generalized anxiety disorder: Secondary | ICD-10-CM | POA: Diagnosis not present

## 2018-03-26 DIAGNOSIS — E291 Testicular hypofunction: Secondary | ICD-10-CM | POA: Diagnosis not present

## 2018-04-02 DIAGNOSIS — F900 Attention-deficit hyperactivity disorder, predominantly inattentive type: Secondary | ICD-10-CM | POA: Diagnosis not present

## 2018-04-02 DIAGNOSIS — F4325 Adjustment disorder with mixed disturbance of emotions and conduct: Secondary | ICD-10-CM | POA: Diagnosis not present

## 2018-04-11 DIAGNOSIS — F4325 Adjustment disorder with mixed disturbance of emotions and conduct: Secondary | ICD-10-CM | POA: Diagnosis not present

## 2018-04-11 DIAGNOSIS — F900 Attention-deficit hyperactivity disorder, predominantly inattentive type: Secondary | ICD-10-CM | POA: Diagnosis not present

## 2018-04-16 DIAGNOSIS — H35071 Retinal telangiectasis, right eye: Secondary | ICD-10-CM | POA: Diagnosis not present

## 2018-04-16 DIAGNOSIS — H2513 Age-related nuclear cataract, bilateral: Secondary | ICD-10-CM | POA: Diagnosis not present

## 2018-04-16 DIAGNOSIS — H04123 Dry eye syndrome of bilateral lacrimal glands: Secondary | ICD-10-CM | POA: Diagnosis not present

## 2018-04-17 DIAGNOSIS — F4325 Adjustment disorder with mixed disturbance of emotions and conduct: Secondary | ICD-10-CM | POA: Diagnosis not present

## 2018-04-17 DIAGNOSIS — F900 Attention-deficit hyperactivity disorder, predominantly inattentive type: Secondary | ICD-10-CM | POA: Diagnosis not present

## 2018-05-07 DIAGNOSIS — E291 Testicular hypofunction: Secondary | ICD-10-CM | POA: Diagnosis not present

## 2018-05-07 DIAGNOSIS — N529 Male erectile dysfunction, unspecified: Secondary | ICD-10-CM | POA: Diagnosis not present

## 2018-05-07 DIAGNOSIS — G4730 Sleep apnea, unspecified: Secondary | ICD-10-CM | POA: Diagnosis not present

## 2018-05-07 DIAGNOSIS — I10 Essential (primary) hypertension: Secondary | ICD-10-CM | POA: Diagnosis not present

## 2018-05-08 DIAGNOSIS — F4325 Adjustment disorder with mixed disturbance of emotions and conduct: Secondary | ICD-10-CM | POA: Diagnosis not present

## 2018-05-08 DIAGNOSIS — F900 Attention-deficit hyperactivity disorder, predominantly inattentive type: Secondary | ICD-10-CM | POA: Diagnosis not present

## 2018-05-14 DIAGNOSIS — G5761 Lesion of plantar nerve, right lower limb: Secondary | ICD-10-CM | POA: Diagnosis not present

## 2018-05-14 DIAGNOSIS — M722 Plantar fascial fibromatosis: Secondary | ICD-10-CM | POA: Diagnosis not present

## 2018-05-28 DIAGNOSIS — E291 Testicular hypofunction: Secondary | ICD-10-CM | POA: Diagnosis not present

## 2018-06-01 DIAGNOSIS — F4325 Adjustment disorder with mixed disturbance of emotions and conduct: Secondary | ICD-10-CM | POA: Diagnosis not present

## 2018-06-01 DIAGNOSIS — F900 Attention-deficit hyperactivity disorder, predominantly inattentive type: Secondary | ICD-10-CM | POA: Diagnosis not present

## 2018-06-03 DIAGNOSIS — F4325 Adjustment disorder with mixed disturbance of emotions and conduct: Secondary | ICD-10-CM | POA: Diagnosis not present

## 2018-06-03 DIAGNOSIS — F900 Attention-deficit hyperactivity disorder, predominantly inattentive type: Secondary | ICD-10-CM | POA: Diagnosis not present

## 2018-06-12 DIAGNOSIS — G4733 Obstructive sleep apnea (adult) (pediatric): Secondary | ICD-10-CM | POA: Diagnosis not present

## 2018-06-19 DIAGNOSIS — F411 Generalized anxiety disorder: Secondary | ICD-10-CM | POA: Diagnosis not present

## 2018-06-19 DIAGNOSIS — F331 Major depressive disorder, recurrent, moderate: Secondary | ICD-10-CM | POA: Diagnosis not present

## 2018-06-25 DIAGNOSIS — F900 Attention-deficit hyperactivity disorder, predominantly inattentive type: Secondary | ICD-10-CM | POA: Diagnosis not present

## 2018-06-25 DIAGNOSIS — F4325 Adjustment disorder with mixed disturbance of emotions and conduct: Secondary | ICD-10-CM | POA: Diagnosis not present

## 2018-07-02 DIAGNOSIS — F900 Attention-deficit hyperactivity disorder, predominantly inattentive type: Secondary | ICD-10-CM | POA: Diagnosis not present

## 2018-07-02 DIAGNOSIS — F4325 Adjustment disorder with mixed disturbance of emotions and conduct: Secondary | ICD-10-CM | POA: Diagnosis not present

## 2018-07-16 DIAGNOSIS — J40 Bronchitis, not specified as acute or chronic: Secondary | ICD-10-CM | POA: Diagnosis not present

## 2018-07-16 DIAGNOSIS — J069 Acute upper respiratory infection, unspecified: Secondary | ICD-10-CM | POA: Diagnosis not present

## 2018-07-31 DIAGNOSIS — F900 Attention-deficit hyperactivity disorder, predominantly inattentive type: Secondary | ICD-10-CM | POA: Diagnosis not present

## 2018-07-31 DIAGNOSIS — F4325 Adjustment disorder with mixed disturbance of emotions and conduct: Secondary | ICD-10-CM | POA: Diagnosis not present

## 2018-09-03 DIAGNOSIS — I10 Essential (primary) hypertension: Secondary | ICD-10-CM | POA: Diagnosis not present

## 2018-09-03 DIAGNOSIS — G4730 Sleep apnea, unspecified: Secondary | ICD-10-CM | POA: Diagnosis not present

## 2018-09-03 DIAGNOSIS — E291 Testicular hypofunction: Secondary | ICD-10-CM | POA: Diagnosis not present

## 2018-09-03 DIAGNOSIS — N529 Male erectile dysfunction, unspecified: Secondary | ICD-10-CM | POA: Diagnosis not present

## 2018-09-04 DIAGNOSIS — E782 Mixed hyperlipidemia: Secondary | ICD-10-CM | POA: Diagnosis not present

## 2018-09-04 DIAGNOSIS — Z125 Encounter for screening for malignant neoplasm of prostate: Secondary | ICD-10-CM | POA: Diagnosis not present

## 2018-10-07 DIAGNOSIS — E291 Testicular hypofunction: Secondary | ICD-10-CM | POA: Diagnosis not present

## 2018-10-09 DIAGNOSIS — E291 Testicular hypofunction: Secondary | ICD-10-CM | POA: Diagnosis not present

## 2018-10-16 DIAGNOSIS — F411 Generalized anxiety disorder: Secondary | ICD-10-CM | POA: Diagnosis not present

## 2018-10-16 DIAGNOSIS — F331 Major depressive disorder, recurrent, moderate: Secondary | ICD-10-CM | POA: Diagnosis not present

## 2018-10-23 DIAGNOSIS — N529 Male erectile dysfunction, unspecified: Secondary | ICD-10-CM | POA: Diagnosis not present

## 2018-11-04 DIAGNOSIS — F900 Attention-deficit hyperactivity disorder, predominantly inattentive type: Secondary | ICD-10-CM | POA: Diagnosis not present

## 2018-11-04 DIAGNOSIS — F4325 Adjustment disorder with mixed disturbance of emotions and conduct: Secondary | ICD-10-CM | POA: Diagnosis not present

## 2018-11-11 DIAGNOSIS — F900 Attention-deficit hyperactivity disorder, predominantly inattentive type: Secondary | ICD-10-CM | POA: Diagnosis not present

## 2018-11-11 DIAGNOSIS — F4325 Adjustment disorder with mixed disturbance of emotions and conduct: Secondary | ICD-10-CM | POA: Diagnosis not present

## 2018-11-20 DIAGNOSIS — F4325 Adjustment disorder with mixed disturbance of emotions and conduct: Secondary | ICD-10-CM | POA: Diagnosis not present

## 2018-11-20 DIAGNOSIS — F900 Attention-deficit hyperactivity disorder, predominantly inattentive type: Secondary | ICD-10-CM | POA: Diagnosis not present

## 2018-12-11 DIAGNOSIS — F4325 Adjustment disorder with mixed disturbance of emotions and conduct: Secondary | ICD-10-CM | POA: Diagnosis not present

## 2018-12-11 DIAGNOSIS — F900 Attention-deficit hyperactivity disorder, predominantly inattentive type: Secondary | ICD-10-CM | POA: Diagnosis not present

## 2019-01-23 DIAGNOSIS — I10 Essential (primary) hypertension: Secondary | ICD-10-CM | POA: Diagnosis not present

## 2019-01-23 DIAGNOSIS — Z79899 Other long term (current) drug therapy: Secondary | ICD-10-CM | POA: Diagnosis not present

## 2019-01-23 DIAGNOSIS — Z125 Encounter for screening for malignant neoplasm of prostate: Secondary | ICD-10-CM | POA: Diagnosis not present

## 2019-01-23 DIAGNOSIS — Z23 Encounter for immunization: Secondary | ICD-10-CM | POA: Diagnosis not present

## 2019-01-23 DIAGNOSIS — Z0000 Encounter for general adult medical examination without abnormal findings: Secondary | ICD-10-CM | POA: Diagnosis not present

## 2019-01-23 DIAGNOSIS — E291 Testicular hypofunction: Secondary | ICD-10-CM | POA: Diagnosis not present

## 2019-01-23 DIAGNOSIS — N529 Male erectile dysfunction, unspecified: Secondary | ICD-10-CM | POA: Diagnosis not present

## 2019-01-23 DIAGNOSIS — E782 Mixed hyperlipidemia: Secondary | ICD-10-CM | POA: Diagnosis not present

## 2019-01-23 DIAGNOSIS — G4730 Sleep apnea, unspecified: Secondary | ICD-10-CM | POA: Diagnosis not present

## 2019-02-25 DIAGNOSIS — F331 Major depressive disorder, recurrent, moderate: Secondary | ICD-10-CM | POA: Diagnosis not present

## 2019-04-28 DIAGNOSIS — H35073 Retinal telangiectasis, bilateral: Secondary | ICD-10-CM | POA: Diagnosis not present

## 2019-04-28 DIAGNOSIS — H31011 Macula scars of posterior pole (postinflammatory) (post-traumatic), right eye: Secondary | ICD-10-CM | POA: Diagnosis not present

## 2019-05-13 DIAGNOSIS — G4733 Obstructive sleep apnea (adult) (pediatric): Secondary | ICD-10-CM | POA: Diagnosis not present

## 2019-06-11 DIAGNOSIS — G4733 Obstructive sleep apnea (adult) (pediatric): Secondary | ICD-10-CM | POA: Diagnosis not present

## 2019-06-24 DIAGNOSIS — F331 Major depressive disorder, recurrent, moderate: Secondary | ICD-10-CM | POA: Diagnosis not present

## 2019-06-24 DIAGNOSIS — F411 Generalized anxiety disorder: Secondary | ICD-10-CM | POA: Diagnosis not present

## 2019-08-08 DIAGNOSIS — M722 Plantar fascial fibromatosis: Secondary | ICD-10-CM | POA: Diagnosis not present

## 2019-08-11 DIAGNOSIS — G4733 Obstructive sleep apnea (adult) (pediatric): Secondary | ICD-10-CM | POA: Diagnosis not present

## 2019-08-18 DIAGNOSIS — M722 Plantar fascial fibromatosis: Secondary | ICD-10-CM | POA: Diagnosis not present

## 2019-09-15 DIAGNOSIS — E782 Mixed hyperlipidemia: Secondary | ICD-10-CM | POA: Diagnosis not present

## 2019-09-15 DIAGNOSIS — N529 Male erectile dysfunction, unspecified: Secondary | ICD-10-CM | POA: Diagnosis not present

## 2019-09-15 DIAGNOSIS — F341 Dysthymic disorder: Secondary | ICD-10-CM | POA: Diagnosis not present

## 2019-09-15 DIAGNOSIS — E291 Testicular hypofunction: Secondary | ICD-10-CM | POA: Diagnosis not present

## 2019-10-15 DIAGNOSIS — A084 Viral intestinal infection, unspecified: Secondary | ICD-10-CM | POA: Diagnosis not present

## 2019-10-19 DIAGNOSIS — B349 Viral infection, unspecified: Secondary | ICD-10-CM | POA: Diagnosis not present

## 2019-10-19 DIAGNOSIS — Z1152 Encounter for screening for COVID-19: Secondary | ICD-10-CM | POA: Diagnosis not present

## 2019-10-20 DIAGNOSIS — B349 Viral infection, unspecified: Secondary | ICD-10-CM | POA: Diagnosis not present

## 2019-11-07 DIAGNOSIS — H35353 Cystoid macular degeneration, bilateral: Secondary | ICD-10-CM | POA: Diagnosis not present

## 2019-11-07 DIAGNOSIS — H04123 Dry eye syndrome of bilateral lacrimal glands: Secondary | ICD-10-CM | POA: Diagnosis not present

## 2019-11-07 DIAGNOSIS — H35071 Retinal telangiectasis, right eye: Secondary | ICD-10-CM | POA: Diagnosis not present

## 2019-11-07 DIAGNOSIS — H2513 Age-related nuclear cataract, bilateral: Secondary | ICD-10-CM | POA: Diagnosis not present

## 2019-11-10 DIAGNOSIS — G4733 Obstructive sleep apnea (adult) (pediatric): Secondary | ICD-10-CM | POA: Diagnosis not present

## 2019-12-23 DIAGNOSIS — F331 Major depressive disorder, recurrent, moderate: Secondary | ICD-10-CM | POA: Diagnosis not present

## 2019-12-23 DIAGNOSIS — F411 Generalized anxiety disorder: Secondary | ICD-10-CM | POA: Diagnosis not present

## 2020-02-09 DIAGNOSIS — G4733 Obstructive sleep apnea (adult) (pediatric): Secondary | ICD-10-CM | POA: Diagnosis not present

## 2020-03-09 DIAGNOSIS — Z20822 Contact with and (suspected) exposure to covid-19: Secondary | ICD-10-CM | POA: Diagnosis not present

## 2020-03-24 DIAGNOSIS — F331 Major depressive disorder, recurrent, moderate: Secondary | ICD-10-CM | POA: Diagnosis not present

## 2020-03-24 DIAGNOSIS — F411 Generalized anxiety disorder: Secondary | ICD-10-CM | POA: Diagnosis not present

## 2020-05-10 DIAGNOSIS — G4733 Obstructive sleep apnea (adult) (pediatric): Secondary | ICD-10-CM | POA: Diagnosis not present

## 2020-06-08 DIAGNOSIS — E782 Mixed hyperlipidemia: Secondary | ICD-10-CM | POA: Diagnosis not present

## 2020-06-08 DIAGNOSIS — Z0000 Encounter for general adult medical examination without abnormal findings: Secondary | ICD-10-CM | POA: Diagnosis not present

## 2020-06-08 DIAGNOSIS — E291 Testicular hypofunction: Secondary | ICD-10-CM | POA: Diagnosis not present

## 2020-06-08 DIAGNOSIS — Z125 Encounter for screening for malignant neoplasm of prostate: Secondary | ICD-10-CM | POA: Diagnosis not present

## 2020-06-08 DIAGNOSIS — N529 Male erectile dysfunction, unspecified: Secondary | ICD-10-CM | POA: Diagnosis not present

## 2020-06-08 DIAGNOSIS — G4730 Sleep apnea, unspecified: Secondary | ICD-10-CM | POA: Diagnosis not present

## 2020-06-08 DIAGNOSIS — I10 Essential (primary) hypertension: Secondary | ICD-10-CM | POA: Diagnosis not present

## 2020-06-16 DIAGNOSIS — G4733 Obstructive sleep apnea (adult) (pediatric): Secondary | ICD-10-CM | POA: Diagnosis not present

## 2020-07-15 DIAGNOSIS — M545 Low back pain, unspecified: Secondary | ICD-10-CM | POA: Diagnosis not present

## 2020-07-30 DIAGNOSIS — R1013 Epigastric pain: Secondary | ICD-10-CM | POA: Diagnosis not present

## 2020-07-30 DIAGNOSIS — Z8371 Family history of colonic polyps: Secondary | ICD-10-CM | POA: Diagnosis not present

## 2020-08-09 DIAGNOSIS — G4733 Obstructive sleep apnea (adult) (pediatric): Secondary | ICD-10-CM | POA: Diagnosis not present

## 2020-08-20 DIAGNOSIS — R7303 Prediabetes: Secondary | ICD-10-CM | POA: Diagnosis not present

## 2020-09-15 DIAGNOSIS — F331 Major depressive disorder, recurrent, moderate: Secondary | ICD-10-CM | POA: Diagnosis not present

## 2020-09-15 DIAGNOSIS — F411 Generalized anxiety disorder: Secondary | ICD-10-CM | POA: Diagnosis not present

## 2020-09-15 DIAGNOSIS — F605 Obsessive-compulsive personality disorder: Secondary | ICD-10-CM | POA: Diagnosis not present

## 2020-09-20 DIAGNOSIS — K2930 Chronic superficial gastritis without bleeding: Secondary | ICD-10-CM | POA: Diagnosis not present

## 2020-09-20 DIAGNOSIS — Z8371 Family history of colonic polyps: Secondary | ICD-10-CM | POA: Diagnosis not present

## 2020-09-20 DIAGNOSIS — Z1211 Encounter for screening for malignant neoplasm of colon: Secondary | ICD-10-CM | POA: Diagnosis not present

## 2020-09-20 DIAGNOSIS — R1013 Epigastric pain: Secondary | ICD-10-CM | POA: Diagnosis not present

## 2020-09-20 DIAGNOSIS — K3189 Other diseases of stomach and duodenum: Secondary | ICD-10-CM | POA: Diagnosis not present

## 2020-10-01 DIAGNOSIS — J0100 Acute maxillary sinusitis, unspecified: Secondary | ICD-10-CM | POA: Diagnosis not present

## 2020-10-20 DIAGNOSIS — Z23 Encounter for immunization: Secondary | ICD-10-CM | POA: Diagnosis not present

## 2020-11-03 DIAGNOSIS — K2970 Gastritis, unspecified, without bleeding: Secondary | ICD-10-CM | POA: Diagnosis not present

## 2020-11-03 DIAGNOSIS — Z8371 Family history of colonic polyps: Secondary | ICD-10-CM | POA: Diagnosis not present

## 2020-11-08 DIAGNOSIS — G4733 Obstructive sleep apnea (adult) (pediatric): Secondary | ICD-10-CM | POA: Diagnosis not present

## 2020-11-09 DIAGNOSIS — H04123 Dry eye syndrome of bilateral lacrimal glands: Secondary | ICD-10-CM | POA: Diagnosis not present

## 2020-11-09 DIAGNOSIS — H35353 Cystoid macular degeneration, bilateral: Secondary | ICD-10-CM | POA: Diagnosis not present

## 2020-11-09 DIAGNOSIS — H2513 Age-related nuclear cataract, bilateral: Secondary | ICD-10-CM | POA: Diagnosis not present

## 2020-12-07 DIAGNOSIS — N529 Male erectile dysfunction, unspecified: Secondary | ICD-10-CM | POA: Diagnosis not present

## 2020-12-07 DIAGNOSIS — R519 Headache, unspecified: Secondary | ICD-10-CM | POA: Diagnosis not present

## 2020-12-07 DIAGNOSIS — E291 Testicular hypofunction: Secondary | ICD-10-CM | POA: Diagnosis not present

## 2020-12-07 DIAGNOSIS — R42 Dizziness and giddiness: Secondary | ICD-10-CM | POA: Diagnosis not present

## 2020-12-09 DIAGNOSIS — E291 Testicular hypofunction: Secondary | ICD-10-CM | POA: Diagnosis not present

## 2020-12-23 DIAGNOSIS — E291 Testicular hypofunction: Secondary | ICD-10-CM | POA: Diagnosis not present

## 2021-01-04 ENCOUNTER — Other Ambulatory Visit: Payer: Self-pay

## 2021-01-04 ENCOUNTER — Ambulatory Visit (INDEPENDENT_AMBULATORY_CARE_PROVIDER_SITE_OTHER): Payer: BC Managed Care – PPO | Admitting: Podiatry

## 2021-01-04 ENCOUNTER — Ambulatory Visit (INDEPENDENT_AMBULATORY_CARE_PROVIDER_SITE_OTHER): Payer: BC Managed Care – PPO

## 2021-01-04 ENCOUNTER — Encounter: Payer: Self-pay | Admitting: Podiatry

## 2021-01-04 DIAGNOSIS — M722 Plantar fascial fibromatosis: Secondary | ICD-10-CM | POA: Diagnosis not present

## 2021-01-04 DIAGNOSIS — M216X9 Other acquired deformities of unspecified foot: Secondary | ICD-10-CM

## 2021-01-04 DIAGNOSIS — M79671 Pain in right foot: Secondary | ICD-10-CM

## 2021-01-04 MED ORDER — MELOXICAM 15 MG PO TABS: 15.0000 mg | ORAL_TABLET | Freq: Every day | ORAL | 0 refills | Status: DC | PRN

## 2021-01-04 NOTE — Patient Instructions (Signed)
For instructions on how to put on your Plantar Fascial Brace, please visit [email or web address]   Plantar Fasciitis (Heel Spur Syndrome) with Rehab The plantar fascia is a fibrous, ligament-like, soft-tissue structure that spans the bottom of the foot. Plantar fasciitis is a condition that causes pain in the foot due to inflammation of the tissue. SYMPTOMS   Pain and tenderness on the underneath side of the foot.  Pain that worsens with standing or walking. CAUSES  Plantar fasciitis is caused by irritation and injury to the plantar fascia on the underneath side of the foot. Common mechanisms of injury include:  Direct trauma to bottom of the foot.  Damage to a small nerve that runs under the foot where the main fascia attaches to the heel bone.  Stress placed on the plantar fascia due to bone spurs. RISK INCREASES WITH:   Activities that place stress on the plantar fascia (running, jumping, pivoting, or cutting).  Poor strength and flexibility.  Improperly fitted shoes.  Tight calf muscles.  Flat feet.  Failure to warm-up properly before activity.  Obesity. PREVENTION  Warm up and stretch properly before activity.  Allow for adequate recovery between workouts.  Maintain physical fitness:  Strength, flexibility, and endurance.  Cardiovascular fitness.  Maintain a health body weight.  Avoid stress on the plantar fascia.  Wear properly fitted shoes, including arch supports for individuals who have flat feet.  PROGNOSIS  If treated properly, then the symptoms of plantar fasciitis usually resolve without surgery. However, occasionally surgery is necessary.  RELATED COMPLICATIONS   Recurrent symptoms that may result in a chronic condition.  Problems of the lower back that are caused by compensating for the injury, such as limping.  Pain or weakness of the foot during push-off following surgery.  Chronic inflammation, scarring, and partial or complete  fascia tear, occurring more often from repeated injections.  TREATMENT  Treatment initially involves the use of ice and medication to help reduce pain and inflammation. The use of strengthening and stretching exercises may help reduce pain with activity, especially stretches of the Achilles tendon. These exercises may be performed at home or with a therapist. Your caregiver may recommend that you use heel cups of arch supports to help reduce stress on the plantar fascia. Occasionally, corticosteroid injections are given to reduce inflammation. If symptoms persist for greater than 6 months despite non-surgical (conservative), then surgery may be recommended.   MEDICATION   If pain medication is necessary, then nonsteroidal anti-inflammatory medications, such as aspirin and ibuprofen, or other minor pain relievers, such as acetaminophen, are often recommended.  Do not take pain medication within 7 days before surgery.  Prescription pain relievers may be given if deemed necessary by your caregiver. Use only as directed and only as much as you need.  Corticosteroid injections may be given by your caregiver. These injections should be reserved for the most serious cases, because they may only be given a certain number of times.  HEAT AND COLD  Cold treatment (icing) relieves pain and reduces inflammation. Cold treatment should be applied for 10 to 15 minutes every 2 to 3 hours for inflammation and pain and immediately after any activity that aggravates your symptoms. Use ice packs or massage the area with a piece of ice (ice massage).  Heat treatment may be used prior to performing the stretching and strengthening activities prescribed by your caregiver, physical therapist, or athletic trainer. Use a heat pack or soak the injury in warm water.  SEEK IMMEDIATE MEDICAL   CARE IF:  Treatment seems to offer no benefit, or the condition worsens.  Any medications produce adverse side effects.   EXERCISES- RANGE OF MOTION (ROM) AND STRETCHING EXERCISES - Plantar Fasciitis (Heel Spur Syndrome) These exercises may help you when beginning to rehabilitate your injury. Your symptoms may resolve with or without further involvement from your physician, physical therapist or athletic trainer. While completing these exercises, remember:   Restoring tissue flexibility helps normal motion to return to the joints. This allows healthier, less painful movement and activity.  An effective stretch should be held for at least 30 seconds.  A stretch should never be painful. You should only feel a gentle lengthening or release in the stretched tissue.  RANGE OF MOTION - Toe Extension, Flexion  Sit with your right / left leg crossed over your opposite knee.  Grasp your toes and gently pull them back toward the top of your foot. You should feel a stretch on the bottom of your toes and/or foot.  Hold this stretch for 10 seconds.  Now, gently pull your toes toward the bottom of your foot. You should feel a stretch on the top of your toes and or foot.  Hold this stretch for 10 seconds. Repeat  times. Complete this stretch 3 times per day.   RANGE OF MOTION - Ankle Dorsiflexion, Active Assisted  Remove shoes and sit on a chair that is preferably not on a carpeted surface.  Place right / left foot under knee. Extend your opposite leg for support.  Keeping your heel down, slide your right / left foot back toward the chair until you feel a stretch at your ankle or calf. If you do not feel a stretch, slide your bottom forward to the edge of the chair, while still keeping your heel down.  Hold this stretch for 10 seconds. Repeat 3 times. Complete this stretch 2 times per day.   STRETCH  Gastroc, Standing  Place hands on wall.  Extend right / left leg, keeping the front knee somewhat bent.  Slightly point your toes inward on your back foot.  Keeping your right / left heel on the floor and your  knee straight, shift your weight toward the wall, not allowing your back to arch.  You should feel a gentle stretch in the right / left calf. Hold this position for 10 seconds. Repeat 3 times. Complete this stretch 2 times per day.  STRETCH  Soleus, Standing  Place hands on wall.  Extend right / left leg, keeping the other knee somewhat bent.  Slightly point your toes inward on your back foot.  Keep your right / left heel on the floor, bend your back knee, and slightly shift your weight over the back leg so that you feel a gentle stretch deep in your back calf.  Hold this position for 10 seconds. Repeat 3 times. Complete this stretch 2 times per day.  STRETCH  Gastrocsoleus, Standing  Note: This exercise can place a lot of stress on your foot and ankle. Please complete this exercise only if specifically instructed by your caregiver.   Place the ball of your right / left foot on a step, keeping your other foot firmly on the same step.  Hold on to the wall or a rail for balance.  Slowly lift your other foot, allowing your body weight to press your heel down over the edge of the step.  You should feel a stretch in your right / left calf.  Hold this   position for 10 seconds.  Repeat this exercise with a slight bend in your right / left knee. Repeat 3 times. Complete this stretch 2 times per day.   STRENGTHENING EXERCISES - Plantar Fasciitis (Heel Spur Syndrome)  These exercises may help you when beginning to rehabilitate your injury. They may resolve your symptoms with or without further involvement from your physician, physical therapist or athletic trainer. While completing these exercises, remember:   Muscles can gain both the endurance and the strength needed for everyday activities through controlled exercises.  Complete these exercises as instructed by your physician, physical therapist or athletic trainer. Progress the resistance and repetitions only as guided.  STRENGTH -  Towel Curls  Sit in a chair positioned on a non-carpeted surface.  Place your foot on a towel, keeping your heel on the floor.  Pull the towel toward your heel by only curling your toes. Keep your heel on the floor. Repeat 3 times. Complete this exercise 2 times per day.  STRENGTH - Ankle Inversion  Secure one end of a rubber exercise band/tubing to a fixed object (table, pole). Loop the other end around your foot just before your toes.  Place your fists between your knees. This will focus your strengthening at your ankle.  Slowly, pull your big toe up and in, making sure the band/tubing is positioned to resist the entire motion.  Hold this position for 10 seconds.  Have your muscles resist the band/tubing as it slowly pulls your foot back to the starting position. Repeat 3 times. Complete this exercises 2 times per day.  Document Released: 05/15/2005 Document Revised: 08/07/2011 Document Reviewed: 08/27/2008 [employer] Patient Information 2014 [employer]. 

## 2021-01-04 NOTE — Progress Notes (Signed)
Subjective:   Patient ID: Andrew Rubio, male   DOB: 60 y.o.   MRN: [id number]   HPI 60 year old male presents the office today with concerns of right heel pain which is been ongoing for 8 months.  He says it hurts in the morning when he first gets up or after sitting and stands back up.  He denies recent injury or trauma. Arches he reports.  He has been taking Aleve and Tylenol without significant improvement.  He does have over-the-counter inserts which helped some.  No rating pain or weakness.  No other concerns.   Review of Systems  All other systems reviewed and are negative.  No past medical history on file.  Past Surgical History:  Procedure Laterality Date   ROTATOR CUFF REPAIR     WRIST FRACTURE SURGERY       Current Outpatient Medications:    amoxicillin-clavulanate (AUGMENTIN) 875-125 MG tablet, Take 1 tablet by mouth 2 (two) times daily., Disp: , Rfl:    docusate sodium (COLACE) 100 MG capsule, Take 100 mg by mouth 2 (two) times daily., Disp: , Rfl:    FLUoxetine (PROZAC) 20 MG capsule, Take by mouth 2 (two) times daily., Disp: , Rfl:    hydrOXYzine (VISTARIL) 25 MG capsule, Take 25 mg by mouth 2 (two) times daily., Disp: , Rfl:    levofloxacin (LEVAQUIN) 500 MG tablet, Take 500 mg by mouth daily., Disp: , Rfl:    meloxicam (MOBIC) 15 MG tablet, Take 15 mg by mouth daily., Disp: , Rfl:    methocarbamol (ROBAXIN) 500 MG tablet, Take 500 mg by mouth 4 (four) times daily. spasms, Disp: , Rfl:    oxyCODONE-acetaminophen (PERCOCET) 10-325 MG per tablet, Take 1-2 tablets by mouth every 4 (four) hours as needed. pain, Disp: , Rfl:    polyethylene glycol-electrolytes (NULYTELY) 420 g solution, Take by mouth., Disp: , Rfl:    pyridOXINE (VITAMIN B-6) 100 MG tablet, Take 100 mg by mouth daily., Disp: , Rfl:    rizatriptan (MAXALT-MLT) 10 MG disintegrating tablet, Take by mouth., Disp: , Rfl:    rosuvastatin (CRESTOR) 20 MG tablet, Take 20 mg by mouth at bedtime., Disp: , Rfl:     tamsulosin (FLOMAX) 0.4 MG CAPS capsule, Take 0.4 mg by mouth daily., Disp: , Rfl:    valsartan-hydrochlorothiazide (DIOVAN-HCT) 80-12.5 MG tablet, Take 1 tablet by mouth daily., Disp: , Rfl:    vitamin C (ASCORBIC ACID) 500 MG tablet, Take 500 mg by mouth daily., Disp: , Rfl:   No Known Allergies        Objective:  Physical Exam  General: AAO x3, NAD  Dermatological: Skin is warm, dry and supple bilateral.  There are no open sores, no preulcerative lesions, no rash or signs of infection present.  Vascular: Dorsalis Pedis artery and Posterior Tibial artery pedal pulses are 2/4 bilateral with immedate capillary fill time. There is no pain with calf compression, swelling, warmth, erythema.   Neruologic: Grossly intact via light touch bilateral. Negative tinel sign.   Musculoskeletal: Tenderness to palpation along the plantar medial tubercle of the calcaneus at the insertion of plantar fascia on the right foot. There is no pain along the course of the plantar fascia within the arch of the foot. Plantar fascia appears to be intact. There is no pain with lateral compression of the calcaneus or pain with vibratory sensation. There is no pain along the course or insertion of the achilles tendon. No other areas of tenderness to bilateral lower extremities.Muscular strength  5/5 in all groups tested bilateral.  Gait: Unassisted, Nonantalgic.       Assessment:   Right heel pain, plan fasciitis     Plan:  -Treatment options discussed including all alternatives, risks, and complications -Etiology of symptoms were discussed -X-rays were obtained and reviewed with the patient.  No evidence of acute fracture or stress fracture.  Heel spurs present. -We discussed steroid injection today.  Start meloxicam.  -Discussed stretching, icing daily.  We discussed shoe modifications and arch supports.  He wants to proceed with custom inserts.  He was measured for them today.  Plantar fascial brace  dispensed as well.  Return in about 4 weeks (around 02/01/2021) for plantar fasciitis, pick up orthotics .  Vivi Barrack DPM

## 2021-01-06 DIAGNOSIS — E291 Testicular hypofunction: Secondary | ICD-10-CM | POA: Diagnosis not present

## 2021-01-31 ENCOUNTER — Other Ambulatory Visit: Payer: Self-pay | Admitting: Podiatry

## 2021-01-31 NOTE — Telephone Encounter (Signed)
Please advise 

## 2021-02-01 ENCOUNTER — Other Ambulatory Visit: Payer: Self-pay

## 2021-02-01 ENCOUNTER — Ambulatory Visit (INDEPENDENT_AMBULATORY_CARE_PROVIDER_SITE_OTHER): Payer: BC Managed Care – PPO | Admitting: Podiatry

## 2021-02-01 ENCOUNTER — Encounter: Payer: Self-pay | Admitting: *Deleted

## 2021-02-01 DIAGNOSIS — M216X9 Other acquired deformities of unspecified foot: Secondary | ICD-10-CM | POA: Diagnosis not present

## 2021-02-01 DIAGNOSIS — M722 Plantar fascial fibromatosis: Secondary | ICD-10-CM | POA: Diagnosis not present

## 2021-02-01 NOTE — Patient Instructions (Signed)

## 2021-02-01 NOTE — Progress Notes (Signed)
Subjective: 59 year old male presents the office today for pulm evaluation of heel pain and poor pressures in the right side.  Overall he does feel that he has been making improvement.  Has been doing stretching as well as doing a foam roller which has been helpful.  Takes meloxicam intermittently.  No recent injury or changes.  Presents to pick up orthotics as well.  No other concerns today.  Objective: AAO x3, NAD DP/PT pulses palpable bilaterally, CRT less than 3 seconds There is mild tenderness palpation on the plantar aspect calcaneus insertion of plantar fascia on the right side.  Plantar fascial is intact.  There is no pain with compression of calcaneus.  Equinus is present.  No other areas of discomfort identified.  MMT 5/5. No pain with calf compression, swelling, warmth, erythema  Assessment: Right heel pain, plantar fasciitis with improvement  Plan: -All treatment options discussed with the patient including all alternatives, risks, complications.  -At this point he seems to be improving.  I encouraged him to continue with stretching, rehab exercises at home.  Ice daily.  Meloxicam as needed.  Orthotics were dispensed and oral and written break-in instructions provided. -Patient encouraged to call the office with any questions, concerns, change in symptoms.   Return in about 4 weeks (around 03/01/2021).  Vivi Barrack DPM

## 2021-02-02 ENCOUNTER — Ambulatory Visit (INDEPENDENT_AMBULATORY_CARE_PROVIDER_SITE_OTHER): Payer: BC Managed Care – PPO | Admitting: Diagnostic Neuroimaging

## 2021-02-02 ENCOUNTER — Encounter: Payer: Self-pay | Admitting: Diagnostic Neuroimaging

## 2021-02-02 VITALS — BP 139/85 | HR 62 | Ht 71.0 in | Wt 280.4 lb

## 2021-02-02 DIAGNOSIS — G43109 Migraine with aura, not intractable, without status migrainosus: Secondary | ICD-10-CM

## 2021-02-02 DIAGNOSIS — R519 Headache, unspecified: Secondary | ICD-10-CM

## 2021-02-02 MED ORDER — TOPIRAMATE 50 MG PO TABS: 50.0000 mg | ORAL_TABLET | Freq: Two times a day (BID) | ORAL | 12 refills | Status: DC

## 2021-02-02 NOTE — Patient Instructions (Signed)
  NEW ONSET HEADACHE (right frontal; constant; positional lightheaded, nausea; since ~2021) - check MRI brain   MIGRAINE PREVENTION  LIFESTYLE CHANGES -Stop or avoid smoking -Decrease or avoid caffeine / alcohol -Eat and sleep on a regular schedule -Exercise several times per week - start topiramate 50mg  at bedtime; after 1-2 weeks increase to 50mg  twice a day; drink plenty of water   MIGRAINE RESCUE  - ibuprofen, tylenol as needed - rizatriptan (Maxalt) 10mg  as needed for breakthrough headache; may repeat x 1 after 2 hours; max 2 tabs per day or 8 per month

## 2021-02-02 NOTE — Progress Notes (Signed)
[facility]  PATIENT: Andrew Rubio DOB: Jun 09, 1960  REFERRING CLINICIAN: Marden Noble, MD HISTORY FROM: patient  REASON FOR VISIT: new consult    HISTORICAL  CHIEF COMPLAINT:  Chief Complaint  Patient presents with   Headache    Rm 7 New Pt "Rizatriptan works really well, if not caught in time I get in dark quiet place; having non migraine headaches now- high right forehead, constant pressure, some nausea, brief weird sensation, Tylenol helps"     HISTORY OF PRESENT ILLNESS:   60 year old male here for evaluation of headaches.  Patient has had headaches since age 29 years old.  He describes crushing frontal headaches with photophobia and nausea.  Sometimes has visual aura.  He was averaging 4-5 headaches per month and treating these with rizatriptan with good results.  Since 2021 patient had new type of headache with constant dull aching sensation over the right frontal region sometimes associated with sinus pressure and nausea.  Symptoms worse with standing up.  Patient has history of obstructive sleep apnea on CPAP.   REVIEW OF SYSTEMS: Full 14 system review of systems performed and negative with exception of: As per HPI.  ALLERGIES: No Known Allergies  HOME MEDICATIONS: Outpatient Medications Prior to Visit  Medication Sig Dispense Refill   aspirin EC 81 MG tablet Take 81 mg by mouth daily. Swallow whole.     b complex vitamins capsule Take 1 capsule by mouth daily.     Cholecalciferol (D3 2000 PO) Take by mouth daily.     FLUoxetine (PROZAC) 20 MG capsule Take 40 mg by mouth daily.     fluticasone (FLONASE) 50 MCG/ACT nasal spray Place into both nostrils daily.     Magnesium 400 MG CAPS Take by mouth daily.     Multiple Vitamin (MULTIVITAMIN) tablet Take 1 tablet by mouth daily.     rizatriptan (MAXALT) 10 MG tablet Take 10 mg by mouth as needed for migraine. May repeat in 2 hours if needed     rosuvastatin (CRESTOR) 20 MG tablet Take 20 mg by  mouth at bedtime.     sildenafil (REVATIO) 20 MG tablet Take 20 mg by mouth 3 (three) times daily. 3-5 once daily as needed     tamsulosin (FLOMAX) 0.4 MG CAPS capsule Take 0.4 mg by mouth.     valsartan-hydrochlorothiazide (DIOVAN-HCT) 80-12.5 MG tablet Take 1 tablet by mouth daily.     vitamin C (ASCORBIC ACID) 500 MG tablet Take 500 mg by mouth daily.     meloxicam (MOBIC) 15 MG tablet TAKE ONE TABLET BY MOUTH DAILY AS NEEDED (Patient not taking: Reported on 02/02/2021) 30 tablet 0   No facility-administered medications prior to visit.    PAST MEDICAL HISTORY: Past Medical History:  Diagnosis Date   Hyperlipidemia    Light headedness    Migraine headache    OSA on CPAP     PAST SURGICAL HISTORY: Past Surgical History:  Procedure Laterality Date   NASAL SEPTUM SURGERY     ROTATOR CUFF REPAIR     WRIST FRACTURE SURGERY Right 08/2011    FAMILY HISTORY: Family History  Problem Relation Age of Onset   Hypertension Mother    Peripheral vascular disease Mother    Hypertension Father    Other Father        dyslipidemia   Other Sister        sepsis    SOCIAL HISTORY: Social History   Socioeconomic History   Marital status: Married  Spouse name: Sheralyn Boatman   Number of children: 1   Years of education: Not on file   Highest education level: Bachelor's degree (e.g., BA, AB, BS)  Occupational History    Comment: industrial painting  Tobacco Use   Smoking status: Never   Smokeless tobacco: Never  Substance and Sexual Activity   Alcohol use: No   Drug use: No   Sexual activity: Not on file  Other Topics Concern   Not on file  Social History Narrative   Caffeine- diet green tea 2 a day   Social Determinants of Health   Financial Resource Strain: Not on file  Food Insecurity: Not on file  Transportation Needs: Not on file  Physical Activity: Not on file  Stress: Not on file  Social Connections: Not on file  Intimate Partner Violence: Not on file     PHYSICAL  EXAM  GENERAL EXAM/CONSTITUTIONAL: Vitals:  Vitals:   02/02/21 0943  BP: 139/85  Pulse: 62  Weight: 280 lb 6.4 oz (127.2 kg)  Height: 5\' 11"  (1.803 m)   Body mass index is 39.11 kg/m. Wt Readings from Last 3 Encounters:  02/02/21 280 lb 6.4 oz (127.2 kg)   Patient is in no distress; well developed, nourished and groomed; neck is supple  CARDIOVASCULAR: Examination of carotid arteries is normal; no carotid bruits Regular rate and rhythm, no murmurs Examination of peripheral vascular system by observation and palpation is normal  EYES: Ophthalmoscopic exam of optic discs and posterior segments is normal; no papilledema or hemorrhages No results found.  MUSCULOSKELETAL: Gait, strength, tone, movements noted in Neurologic exam below  NEUROLOGIC: MENTAL STATUS:  No flowsheet data found. awake, alert, oriented to person, place and time recent and remote memory intact normal attention and concentration language fluent, comprehension intact, naming intact fund of knowledge appropriate  CRANIAL NERVE:  2nd - no papilledema on fundoscopic exam 2nd, 3rd, 4th, 6th - pupils equal and reactive to light, visual fields full to confrontation, extraocular muscles intact, no nystagmus 5th - facial sensation symmetric 7th - facial strength symmetric 8th - hearing intact 9th - palate elevates symmetrically, uvula midline 11th - shoulder shrug symmetric 12th - tongue protrusion midline  MOTOR:  normal bulk and tone, full strength in the BUE, BLE  SENSORY:  normal and symmetric to light touch, temperature, vibration  COORDINATION:  finger-nose-finger, fine finger movements normal  REFLEXES:  deep tendon reflexes TRACE and symmetric  GAIT/STATION:  narrow based gait     DIAGNOSTIC DATA (LABS, IMAGING, TESTING) - I reviewed patient records, labs, notes, testing and imaging myself where available.  Lab Results  Component Value Date   WBC 3.8 (L) 02/27/2011   HGB 16.6  02/27/2011   HCT 47.5 02/27/2011   MCV 89.1 02/27/2011   PLT 182 02/27/2011      Component Value Date/Time   NA 139 02/27/2011 0902   K 4.9 02/27/2011 0902   CL 104 02/27/2011 0902   CO2 27 02/27/2011 0902   GLUCOSE 78 02/27/2011 0902   BUN 19 02/27/2011 0902   CREATININE 0.96 02/27/2011 0902   CALCIUM 10.1 02/27/2011 0902   PROT 7.1 02/27/2011 0902   ALBUMIN 4.2 02/27/2011 0902   AST 29 02/27/2011 0902   ALT 31 02/27/2011 0902   ALKPHOS 72 02/27/2011 0902   BILITOT 1.0 02/27/2011 0902   GFRNONAA >90 02/27/2011 0902   GFRAA >90 02/27/2011 0902   No results found for: CHOL, HDL, LDLCALC, LDLDIRECT, TRIG, CHOLHDL No results found for: 04/29/2011 No  results found for: VITAMINB12 No results found for: TSH     ASSESSMENT AND PLAN  60 y.o. year old male here with history of migraine headaches are since age 19 years old, now with new type of headache starting in 2021 with right frontal pain, nausea with standing, constant pain.  We will proceed with further work-up to rule out other secondary causes.  Also will try migraine treatments.   Dx:  1. New onset of headaches after age 53   2. Migraine with aura and without status migrainosus, not intractable       PLAN:  NEW ONSET HEADACHE (right frontal; constant; positional lightheaded, nausea; since ~2021) - check MRI brain   MIGRAINE TREATMENT PLAN:  MIGRAINE PREVENTION  LIFESTYLE CHANGES -Stop or avoid smoking -Decrease or avoid caffeine / alcohol -Eat and sleep on a regular schedule -Exercise several times per week  - start topiramate 50mg  at bedtime; after 1-2 weeks increase to 50mg  twice a day; drink plenty of water   Consider 2nd line - erenumab (Aimovig) 70mg  monthly (may increase to 140mg  monthly) - fremanezumab (Ajovy) 225mg  monthly (or 675mg  every 3 months) - galazanezumab (Emgality) 240mg  loading dose; then 120mg  monthly   MIGRAINE RESCUE  - ibuprofen, tylenol as needed - rizatriptan (Maxalt) 10mg  as  needed for breakthrough headache; may repeat x 1 after 2 hours; max 2 tabs per day or 8 per month   OSA - continue CPAP   Orders Placed This Encounter  Procedures   MR BRAIN W WO CONTRAST   Meds ordered this encounter  Medications   topiramate (TOPAMAX) 50 MG tablet    Sig: Take 1 tablet (50 mg total) by mouth 2 (two) times daily.    Dispense:  60 tablet    Refill:  12   Return in about 6 months (around 08/02/2021).    , MD 02/02/2021, 10:34 AM Certified in Neurology, Neurophysiology and Neuroimaging  [facility] [address] [place],  [phone]

## 2021-02-03 ENCOUNTER — Telehealth: Payer: Self-pay | Admitting: Diagnostic Neuroimaging

## 2021-02-03 ENCOUNTER — Other Ambulatory Visit: Payer: Self-pay | Admitting: Podiatry

## 2021-02-03 NOTE — Telephone Encounter (Signed)
unable to leave vmail the mail box is full EE  02/03/21 [employer] auth: [id number] (exp. 02/03/21 to 03/04/21)

## 2021-02-07 NOTE — Telephone Encounter (Signed)
unable to leave vmail the mail box is full °

## 2021-02-08 DIAGNOSIS — G4733 Obstructive sleep apnea (adult) (pediatric): Secondary | ICD-10-CM | POA: Diagnosis not present

## 2021-02-24 DIAGNOSIS — E291 Testicular hypofunction: Secondary | ICD-10-CM | POA: Diagnosis not present

## 2021-03-01 ENCOUNTER — Ambulatory Visit: Payer: BC Managed Care – PPO | Admitting: Podiatry

## 2021-03-04 ENCOUNTER — Other Ambulatory Visit: Payer: Self-pay | Admitting: Podiatry

## 2021-03-07 NOTE — Telephone Encounter (Signed)
LVM for pt to call back to schedule.

## 2021-03-07 NOTE — Telephone Encounter (Signed)
MR Brain w/wo contrast Dr. Theresa Mulligan Berkley Harvey: [id number] (exp. 03/07/21 to 04/05/21). Patient is scheduled at [facility] for 03/08/21.

## 2021-03-08 ENCOUNTER — Ambulatory Visit: Payer: BC Managed Care – PPO

## 2021-03-08 DIAGNOSIS — R519 Headache, unspecified: Secondary | ICD-10-CM

## 2021-03-08 MED ORDER — GADOBENATE DIMEGLUMINE 529 MG/ML IV SOLN
20.0000 mL | Freq: Once | INTRAVENOUS | Status: AC | PRN
  Administered 2021-03-08: 20 mL via INTRAVENOUS

## 2021-03-10 DIAGNOSIS — G4733 Obstructive sleep apnea (adult) (pediatric): Secondary | ICD-10-CM | POA: Diagnosis not present

## 2021-03-14 DIAGNOSIS — E669 Obesity, unspecified: Secondary | ICD-10-CM | POA: Diagnosis not present

## 2021-03-14 DIAGNOSIS — K9049 Malabsorption due to intolerance, not elsewhere classified: Secondary | ICD-10-CM | POA: Diagnosis not present

## 2021-03-14 DIAGNOSIS — Z6841 Body Mass Index (BMI) 40.0 and over, adult: Secondary | ICD-10-CM | POA: Diagnosis not present

## 2021-03-14 DIAGNOSIS — E785 Hyperlipidemia, unspecified: Secondary | ICD-10-CM | POA: Diagnosis not present

## 2021-03-14 DIAGNOSIS — Z7689 Persons encountering health services in other specified circumstances: Secondary | ICD-10-CM | POA: Diagnosis not present

## 2021-03-16 DIAGNOSIS — F605 Obsessive-compulsive personality disorder: Secondary | ICD-10-CM | POA: Diagnosis not present

## 2021-03-16 DIAGNOSIS — F331 Major depressive disorder, recurrent, moderate: Secondary | ICD-10-CM | POA: Diagnosis not present

## 2021-03-16 DIAGNOSIS — F411 Generalized anxiety disorder: Secondary | ICD-10-CM | POA: Diagnosis not present

## 2021-03-18 DIAGNOSIS — R519 Headache, unspecified: Secondary | ICD-10-CM | POA: Diagnosis not present

## 2021-03-24 DIAGNOSIS — E291 Testicular hypofunction: Secondary | ICD-10-CM | POA: Diagnosis not present

## 2021-03-29 DIAGNOSIS — E669 Obesity, unspecified: Secondary | ICD-10-CM | POA: Diagnosis not present

## 2021-03-29 DIAGNOSIS — G4730 Sleep apnea, unspecified: Secondary | ICD-10-CM | POA: Diagnosis not present

## 2021-03-29 DIAGNOSIS — R0609 Other forms of dyspnea: Secondary | ICD-10-CM | POA: Diagnosis not present

## 2021-03-29 DIAGNOSIS — M545 Low back pain, unspecified: Secondary | ICD-10-CM | POA: Diagnosis not present

## 2021-04-07 DIAGNOSIS — E291 Testicular hypofunction: Secondary | ICD-10-CM | POA: Diagnosis not present

## 2021-04-10 DIAGNOSIS — G4733 Obstructive sleep apnea (adult) (pediatric): Secondary | ICD-10-CM | POA: Diagnosis not present

## 2021-04-11 ENCOUNTER — Other Ambulatory Visit: Payer: Self-pay

## 2021-04-11 ENCOUNTER — Encounter: Payer: Self-pay | Admitting: Cardiology

## 2021-04-11 ENCOUNTER — Ambulatory Visit: Payer: BC Managed Care – PPO | Admitting: Cardiology

## 2021-04-11 VITALS — BP 139/74 | HR 74 | Temp 97.3°F | Resp 17 | Ht 71.0 in | Wt 290.8 lb

## 2021-04-11 DIAGNOSIS — I10 Essential (primary) hypertension: Secondary | ICD-10-CM | POA: Diagnosis not present

## 2021-04-11 DIAGNOSIS — E7800 Pure hypercholesterolemia, unspecified: Secondary | ICD-10-CM | POA: Diagnosis not present

## 2021-04-11 DIAGNOSIS — R0609 Other forms of dyspnea: Secondary | ICD-10-CM | POA: Diagnosis not present

## 2021-04-11 DIAGNOSIS — Z6841 Body Mass Index (BMI) 40.0 and over, adult: Secondary | ICD-10-CM

## 2021-04-11 NOTE — Progress Notes (Signed)
Primary Physician/Referring:  Marden Noble, MD  Patient ID: Andrew Rubio, male    DOB: 11/14/1960, 60 y.o.   MRN: [id number]  Chief Complaint  Patient presents with   New Patient (Initial Visit)   Shortness of Breath   HPI:    Andrew Rubio  is a 60 y.o. Caucasian male patient with hypertension, hyperlipidemia, hypogonadism, obstructive sleep apnea on CPAP is now referred to me for evaluation of dyspnea on exertion, patient wants to start exercise program for weight loss.  Except for mild dyspnea on exertion as he is sedentary, he has no other specific complaints.  No recent weight changes.  Past Medical History:  Diagnosis Date   Hyperlipidemia    Light headedness    Migraine headache    OSA on CPAP    Past Surgical History:  Procedure Laterality Date   NASAL SEPTUM SURGERY     ROTATOR CUFF REPAIR     WRIST FRACTURE SURGERY Right 08/2011   Family History  Problem Relation Age of Onset   Hypertension Mother    Peripheral vascular disease Mother    Hypertension Father    Other Father        dyslipidemia   Other Sister        sepsis    Social History   Tobacco Use   Smoking status: Never   Smokeless tobacco: Never  Substance Use Topics   Alcohol use: Yes    Comment: OCC   Marital Status: Married  ROS  Review of Systems  Cardiovascular:  Positive for dyspnea on exertion. Negative for chest pain and leg swelling.  Gastrointestinal:  Negative for melena.  Objective  Blood pressure 139/74, pulse 74, temperature (!) 97.3 F (36.3 C), temperature source Temporal, resp. rate 17, height 5\' 11"  (1.803 m), weight 290 lb 12.8 oz (131.9 kg), SpO2 97 %. Body mass index is 40.56 kg/m.  Vitals with BMI 04/11/2021 04/11/2021 02/02/2021  Height - 5\' 11"  5\' 11"   Weight - 290 lbs 13 oz 280 lbs 6 oz  BMI - 40.58 39.13  Systolic 139 152 04/04/2021  Diastolic 74 77 85  Pulse 74 75 62    Physical Exam Constitutional:      Appearance: He is morbidly obese.  Neck:     Vascular: No  carotid bruit or JVD.  Cardiovascular:     Rate and Rhythm: Normal rate and regular rhythm.     Pulses: Intact distal pulses.     Heart sounds: Normal heart sounds. No murmur heard.   No gallop.  Pulmonary:     Effort: Pulmonary effort is normal.     Breath sounds: Normal breath sounds.  Abdominal:     General: Bowel sounds are normal.     Palpations: Abdomen is soft.  Musculoskeletal:        General: No swelling.     Laboratory examination:   External labs:   Labs 12/07/2020:  Serum testosterone total 240.6.  TSH normal at 2.50.  Total cholesterol 180, triglycerides 264, HDL 37, LDL 98.  Non-HDL cholesterol 143.  Hb 15.5/HCT 44.9, platelets 200.  Normal indicis.  Serum glucose 97 mg, BUN 10, creatinine 1.10, potassium 4.8, CMP otherwise normal. Medications and allergies   Allergies  Allergen Reactions   Zocor [Simvastatin]     Other reaction(s): leg and back pain     Medication prior to this encounter:   Outpatient Medications Prior to Visit  Medication Sig Dispense Refill   aspirin EC 81 MG tablet  Take 81 mg by mouth daily. Swallow whole.     b complex vitamins capsule Take 1 capsule by mouth daily.     Cholecalciferol (D3 2000 PO) Take by mouth daily.     FLUoxetine (PROZAC) 20 MG capsule Take 40 mg by mouth daily.     fluticasone (FLONASE) 50 MCG/ACT nasal spray Place into both nostrils daily.     hydrOXYzine (VISTARIL) 25 MG capsule Take 3 capsules by mouth in the morning and at bedtime. ONE TABLET IN THE AM ONE IN THE PM     Magnesium 400 MG CAPS Take by mouth daily.     meloxicam (MOBIC) 15 MG tablet TAKE ONE TABLET BY MOUTH DAILY AS NEEDED 30 tablet 0   Multiple Vitamin (MULTIVITAMIN) tablet Take 1 tablet by mouth daily.     rizatriptan (MAXALT) 10 MG tablet Take 10 mg by mouth as needed for migraine. May repeat in 2 hours if needed     rosuvastatin (CRESTOR) 20 MG tablet Take 20 mg by mouth at bedtime.     sildenafil (REVATIO) 20 MG tablet Take 20 mg by  mouth 3 (three) times daily. 3-5 once daily as needed     tamsulosin (FLOMAX) 0.4 MG CAPS capsule Take 0.4 mg by mouth.     topiramate (TOPAMAX) 50 MG tablet Take 1 tablet (50 mg total) by mouth 2 (two) times daily. 60 tablet 12   valsartan-hydrochlorothiazide (DIOVAN-HCT) 80-12.5 MG tablet Take 1 tablet by mouth daily.     vitamin C (ASCORBIC ACID) 500 MG tablet Take 500 mg by mouth daily.     vitamin C (ASCORBIC ACID) 500 MG tablet 1 tablet     No facility-administered medications prior to visit.     Medication list after today's encounter   Current Outpatient Medications  Medication Instructions   aspirin EC 81 mg, Oral, Daily, Swallow whole.   b complex vitamins capsule 1 capsule, Oral, Daily   Cholecalciferol (D3 2000 PO) Oral, Daily   FLUoxetine (PROZAC) 40 mg, Oral, Daily   fluticasone (FLONASE) 50 MCG/ACT nasal spray Each Nare, Daily   hydrOXYzine (VISTARIL) 25 MG capsule 3 capsules, Oral, 2 times daily, ONE TABLET IN THE AM ONE IN THE PM   Magnesium 400 MG CAPS Oral, Daily   meloxicam (MOBIC) 15 MG tablet TAKE ONE TABLET BY MOUTH DAILY AS NEEDED   Multiple Vitamin (MULTIVITAMIN) tablet 1 tablet, Oral, Daily   rizatriptan (MAXALT) 10 mg, Oral, As needed, May repeat in 2 hours if needed   rosuvastatin (CRESTOR) 20 mg, Oral, Daily at bedtime   sildenafil (REVATIO) 20 mg, Oral, 3 times daily, 3-5 once daily as needed   tamsulosin (FLOMAX) 0.4 mg, Oral   topiramate (TOPAMAX) 50 mg, Oral, 2 times daily   valsartan-hydrochlorothiazide (DIOVAN-HCT) 80-12.5 MG tablet 1 tablet, Oral, Daily   vitamin C (ASCORBIC ACID) 500 mg, Oral, Daily    Radiology:   No results found.  Cardiac Studies:   NA  EKG:   EKG 04/11/2021: Normal sinus rhythm at rate of 73 bpm, left ankle enlargement, normal axis.  Incomplete right bundle branch block.  No evidence of ischemia.    Assessment     ICD-10-CM   1. Dyspnea on exertion  R06.09 EKG 12-Lead    PCV ECHOCARDIOGRAM COMPLETE    PCV  CARDIAC STRESS TEST    CT CARDIAC SCORING ([facility] LOCATIONS ONLY)    2. Class 3 severe obesity due to excess calories without serious comorbidity with body mass index (BMI) of  40.0 to 44.9 in adult [facility])  E66.01    Z68.41     3. Primary hypertension  I10     4. Pure hypercholesterolemia  E78.00 CT CARDIAC SCORING ([facility] LOCATIONS ONLY)       Medications Discontinued During This Encounter  Medication Reason   vitamin C (ASCORBIC ACID) 500 MG tablet Duplicate    No orders of the defined types were placed in this encounter.  Orders Placed This Encounter  Procedures   CT CARDIAC SCORING ([facility] LOCATIONS ONLY)    Standing Status:   Future    Standing Expiration Date:   06/11/2021    Order Specific Question:   Preferred imaging location?    Answer:   [facility]   PCV CARDIAC STRESS TEST    Standing Status:   Future    Standing Expiration Date:   06/11/2021   EKG 12-Lead   PCV ECHOCARDIOGRAM COMPLETE    Standing Status:   Future    Standing Expiration Date:   04/11/2022   Recommendations:   Andrew Rubio is a 60 y.o. Caucasian male patient with hypertension, hyperlipidemia, hypogonadism, obstructive sleep apnea on CPAP is now referred to me for evaluation of dyspnea on exertion, patient wants to start exercise program for weight loss.  Patient is markedly sedentary.  Physical examination except for morbid obesity is unremarkable.  EKG is within normal limits.  I reviewed his external labs, lipids are well controlled, blood pressure is also well controlled and there is no clinical evidence of heart failure.  He has been compliant with CPAP.  Obtain coronary calcium score for further cardiac risk stratification.  Obtain routine treadmill exercise stress test to evaluate his dyspnea, echocardiogram.  No changes in the medications were done today.  He is appropriately on statins and also antihypertensive medications including ARB.  Weight loss counseling performed today.  He may benefit from referral  to wellness clinic.  I will see him back after the test and make further recommendations.    Yates Decamp, MD, [facility] 04/11/2021, 3:38 PM Office: [phone]

## 2021-04-18 ENCOUNTER — Ambulatory Visit: Payer: BC Managed Care – PPO

## 2021-04-18 ENCOUNTER — Other Ambulatory Visit: Payer: Self-pay

## 2021-04-18 DIAGNOSIS — R0609 Other forms of dyspnea: Secondary | ICD-10-CM | POA: Diagnosis not present

## 2021-04-18 DIAGNOSIS — H35073 Retinal telangiectasis, bilateral: Secondary | ICD-10-CM | POA: Diagnosis not present

## 2021-04-18 DIAGNOSIS — H31011 Macula scars of posterior pole (postinflammatory) (post-traumatic), right eye: Secondary | ICD-10-CM | POA: Diagnosis not present

## 2021-05-09 DIAGNOSIS — G4733 Obstructive sleep apnea (adult) (pediatric): Secondary | ICD-10-CM | POA: Diagnosis not present

## 2021-05-11 ENCOUNTER — Ambulatory Visit
Admission: RE | Admit: 2021-05-11 | Discharge: 2021-05-11 | Disposition: A | Discharge status: Home / Self Care | Payer: No Typology Code available for payment source | Source: Ambulatory Visit | Attending: Cardiology | Admitting: Cardiology

## 2021-05-11 DIAGNOSIS — E7800 Pure hypercholesterolemia, unspecified: Secondary | ICD-10-CM

## 2021-05-11 DIAGNOSIS — R0609 Other forms of dyspnea: Secondary | ICD-10-CM

## 2021-05-11 NOTE — Progress Notes (Signed)
Coronary calcium score 05/11/2021: Total score of 0.  Ascending and descending thoracic aortic measurements are normal. No significant extracardiac abnormality.

## 2021-05-27 ENCOUNTER — Encounter: Payer: Self-pay | Admitting: Cardiology

## 2021-05-27 ENCOUNTER — Ambulatory Visit: Payer: BC Managed Care – PPO | Admitting: Cardiology

## 2021-05-27 ENCOUNTER — Other Ambulatory Visit: Payer: Self-pay

## 2021-05-27 VITALS — BP 133/84 | HR 70 | Temp 98.0°F | Resp 17 | Ht 71.0 in | Wt 284.4 lb

## 2021-05-27 DIAGNOSIS — I10 Essential (primary) hypertension: Secondary | ICD-10-CM

## 2021-05-27 DIAGNOSIS — R0609 Other forms of dyspnea: Secondary | ICD-10-CM | POA: Diagnosis not present

## 2021-05-27 DIAGNOSIS — E7800 Pure hypercholesterolemia, unspecified: Secondary | ICD-10-CM | POA: Diagnosis not present

## 2021-05-27 NOTE — Progress Notes (Signed)
Primary Physician/Referring:  Marden Noble, MD  Patient ID: Andrew Rubio, male    DOB: 1960-12-01, 60 y.o.   MRN: [id number]  Chief Complaint  Patient presents with   Follow-up    6 WEEKS   Shortness of Breath   HPI:    Andrew Rubio  is a 60 y.o. Caucasian male patient with hypertension, hyperlipidemia, hypogonadism, obstructive sleep apnea on CPAP.  Patient was originally referred for evaluation of dyspnea on exertion as he is wanting to start an exercise program for weight loss.  Notably lives a relatively sedentary life, and with the exception of mild dyspnea on exertion is asymptomatic.  Patient was last seen in our office and given symptoms of dyspnea multiple cardiovascular risk factors recommended exercise stress test, echocardiogram, and coronary calcium scoring.  Stress test was without ischemic changes on EKG, echocardiogram essentially normal, and coronary calcium score of 0.  Patient does continue to have mild dyspnea on exertion.  Denies chest pain, palpitations, syncope, near syncope, orthopnea, PND.  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  Past Medical History:  Diagnosis Date   Hyperlipidemia    Light headedness    Migraine headache    OSA on CPAP    Past Surgical History:  Procedure Laterality Date   NASAL SEPTUM SURGERY     ROTATOR CUFF REPAIR     WRIST FRACTURE SURGERY Right 08/2011   Family History  Problem Relation Age of Onset   Hypertension Mother    Peripheral vascular disease Mother    Hypertension Father    Other Father        dyslipidemia   Other Sister        sepsis    Social History   Tobacco Use   Smoking status: Never   Smokeless tobacco: Never  Substance Use Topics   Alcohol use: Yes    Comment: OCC   Marital Status: Married  ROS  Review of Systems  Cardiovascular:  Positive for dyspnea on exertion (mild, stable). Negative for chest pain, claudication, leg  swelling, near-syncope, orthopnea, palpitations, paroxysmal nocturnal dyspnea and syncope.  Respiratory:  Negative for shortness of breath.   Gastrointestinal:  Negative for melena.  Neurological:  Negative for dizziness.  Objective  Blood pressure 133/84, pulse 70, temperature 98 F (36.7 C), temperature source Temporal, resp. rate 17, height 5\' 11"  (1.803 m), weight 284 lb 6.4 oz (129 kg), SpO2 95 %. Body mass index is 39.67 kg/m.  Vitals with BMI 05/27/2021 04/11/2021 04/11/2021  Height 5\' 11"  - 5\' 11"   Weight 284 lbs 6 oz - 290 lbs 13 oz  BMI 39.68 - 40.58  Systolic 133 139 04/13/2021  Diastolic 84 74 77  Pulse 70 74 75    Physical Exam Constitutional:      Appearance: He is morbidly obese.  Neck:     Vascular: No carotid bruit or JVD.  Cardiovascular:     Rate and Rhythm: Normal rate and regular rhythm.     Pulses: Intact distal pulses.     Heart sounds: Normal heart sounds. No murmur heard.   No gallop.  Pulmonary:     Effort: Pulmonary effort is normal.     Breath sounds: Normal breath sounds.  Abdominal:     General: Bowel sounds are normal.     Palpations: Abdomen is soft.  Musculoskeletal:        General: No swelling.  Physical exam unchanged compared  to previous.  Laboratory examination:   External labs:   Labs 12/07/2020:  Serum testosterone total 240.6.  TSH normal at 2.50.  Total cholesterol 180, triglycerides 264, HDL 37, LDL 98.  Non-HDL cholesterol 143.  Hb 15.5/HCT 44.9, platelets 200.  Normal indicis.  Serum glucose 97 mg, BUN 10, creatinine 1.10, potassium 4.8, CMP otherwise normal.  Allergies   Allergies  Allergen Reactions   Zocor [Simvastatin]     Other reaction(s): leg and back pain    Medication prior to this encounter:   Outpatient Medications Prior to Visit  Medication Sig Dispense Refill   aspirin EC 81 MG tablet Take 81 mg by mouth daily. Swallow whole.     b complex vitamins capsule Take 1 capsule by mouth daily.     Cholecalciferol  (D3 2000 PO) Take by mouth daily.     FLUoxetine (PROZAC) 20 MG capsule Take 40 mg by mouth daily.     fluticasone (FLONASE) 50 MCG/ACT nasal spray Place into both nostrils daily.     hydrOXYzine (VISTARIL) 25 MG capsule Take 3 capsules by mouth in the morning and at bedtime. ONE TABLET IN THE AM ONE IN THE PM     Magnesium 400 MG CAPS Take by mouth daily.     meloxicam (MOBIC) 15 MG tablet TAKE ONE TABLET BY MOUTH DAILY AS NEEDED 30 tablet 0   Multiple Vitamin (MULTIVITAMIN) tablet Take 1 tablet by mouth daily.     rizatriptan (MAXALT) 10 MG tablet Take 10 mg by mouth as needed for migraine. May repeat in 2 hours if needed     rosuvastatin (CRESTOR) 20 MG tablet Take 20 mg by mouth at bedtime.     tamsulosin (FLOMAX) 0.4 MG CAPS capsule Take 0.4 mg by mouth.     topiramate (TOPAMAX) 50 MG tablet Take 1 tablet (50 mg total) by mouth 2 (two) times daily. 60 tablet 12   valsartan-hydrochlorothiazide (DIOVAN-HCT) 80-12.5 MG tablet Take 1 tablet by mouth daily.     vitamin C (ASCORBIC ACID) 500 MG tablet Take 500 mg by mouth daily.     sildenafil (REVATIO) 20 MG tablet Take 20 mg by mouth 3 (three) times daily. 3-5 once daily as needed     No facility-administered medications prior to visit.    Medication list after today's encounter   Current Outpatient Medications  Medication Instructions   aspirin EC 81 mg, Oral, Daily, Swallow whole.   b complex vitamins capsule 1 capsule, Oral, Daily   Cholecalciferol (D3 2000 PO) Oral, Daily   FLUoxetine (PROZAC) 40 mg, Oral, Daily   fluticasone (FLONASE) 50 MCG/ACT nasal spray Each Nare, Daily   hydrOXYzine (VISTARIL) 25 MG capsule 3 capsules, Oral, 2 times daily, ONE TABLET IN THE AM ONE IN THE PM   Magnesium 400 MG CAPS Oral, Daily   meloxicam (MOBIC) 15 MG tablet TAKE ONE TABLET BY MOUTH DAILY AS NEEDED   Multiple Vitamin (MULTIVITAMIN) tablet 1 tablet, Oral, Daily   rizatriptan (MAXALT) 10 mg, Oral, As needed, May repeat in 2 hours if needed    rosuvastatin (CRESTOR) 20 mg, Oral, Daily at bedtime   tamsulosin (FLOMAX) 0.4 mg, Oral   topiramate (TOPAMAX) 50 mg, Oral, 2 times daily   valsartan-hydrochlorothiazide (DIOVAN-HCT) 80-12.5 MG tablet 1 tablet, Oral, Daily   vitamin C (ASCORBIC ACID) 500 mg, Oral, Daily   Radiology:   No results found.  Cardiac Studies:   PCV CARDIAC STRESS TEST 04/18/2021  Narrative Treadmill Exercise Stress 04/18/2021: The patient exercised  for 6 minutes and 9 seconds on Bruce protocol; achieved 7.25 METs at 89% of maximum predicted heart rate. Chest pain is not present. The heart rate response was normal. The blood pressure response was normal. he baseline blood pressure was 146/88 mmHg and increased to 200/60 mmHg at peak exercise, which is a normal response to exercise. The calculated Duke Treadmill Score is 6.15.  PCV ECHOCARDIOGRAM COMPLETE 04/18/2021  Narrative Echocardiogram 04/18/2021: Left ventricle cavity is normal in size. Moderate concentric hypertrophy of the left ventricle. Normal global wall motion. Normal LV systolic function with visual EF 62%. Normal diastolic filling pattern. Mild (Grade I) mitral regurgitation. IVC not seen.   CORONARY CALCIUM SCORES 05/11/2021: Left Main: 0 LAD: 0 LCx: 0 RCA: 0 Total Agatston Score: 0 MESA database percentile: 0   AORTA MEASUREMENTS: Ascending Aorta: 30 mm Descending Aorta: 26 mm   OTHER FINDINGS: The heart size is within normal limits. No pericardial fluid is identified. Visualized segments of the thoracic aorta and central pulmonary arteries are normal in caliber. Visualized mediastinum and hilar regions demonstrate no lymphadenopathy or masses. Visualized lungs show no evidence of pulmonary edema, consolidation, pneumothorax, nodule or pleural fluid. Visualized upper abdomen and bony structures are unremarkable.   IMPRESSION: Coronary calcium score of 0.  EKG:   EKG 04/11/2021: Normal sinus rhythm at rate of 73 bpm, left  ankle enlargement, normal axis.  Incomplete right bundle branch block.  No evidence of ischemia.    Assessment     ICD-10-CM   1. Dyspnea on exertion  R06.09     2. Primary hypertension  I10     3. Pure hypercholesterolemia  E78.00        Medications Discontinued During This Encounter  Medication Reason   sildenafil (REVATIO) 20 MG tablet     No orders of the defined types were placed in this encounter.   No orders of the defined types were placed in this encounter.  Recommendations:   Andrew Rubio is a 60 y.o. Caucasian male patient with hypertension, hyperlipidemia, hypogonadism, obstructive sleep apnea on CPAP.  Patient was originally referred for evaluation of dyspnea on exertion as he is wanting to start an exercise program for weight loss.  Notably lives a relatively sedentary life, and with the exception of mild dyspnea on exertion is asymptomatic.  Patient was last seen in our office and given symptoms of dyspnea multiple cardiovascular risk factors recommended exercise stress test, echocardiogram, and coronary calcium scoring.  Stress test was without ischemic changes on EKG, echocardiogram essentially normal, and coronary calcium score of 0.  Patient does continue to have mild dyspnea on exertion which is likely related to obesity and deconditioning as cardiovascular work-up has been reassuring.  Patient's blood pressure is well controlled.  Encouraged him to continue to focus on diet and lifestyle modifications and increasing physical activity.  He is stable from a cardiovascular standpoint.  Follow-up as needed, will defer further primary prevention to PCP.   Rayford Halsted, PA-C 05/27/2021, 12:34 PM Office: [phone]

## 2021-06-02 ENCOUNTER — Ambulatory Visit: Payer: BC Managed Care – PPO | Admitting: Allergy & Immunology

## 2021-06-03 DIAGNOSIS — L538 Other specified erythematous conditions: Secondary | ICD-10-CM | POA: Diagnosis not present

## 2021-06-03 DIAGNOSIS — L814 Other melanin hyperpigmentation: Secondary | ICD-10-CM | POA: Diagnosis not present

## 2021-06-03 DIAGNOSIS — D225 Melanocytic nevi of trunk: Secondary | ICD-10-CM | POA: Diagnosis not present

## 2021-06-03 DIAGNOSIS — L820 Inflamed seborrheic keratosis: Secondary | ICD-10-CM | POA: Diagnosis not present

## 2021-06-03 DIAGNOSIS — R208 Other disturbances of skin sensation: Secondary | ICD-10-CM | POA: Diagnosis not present

## 2021-06-03 DIAGNOSIS — L570 Actinic keratosis: Secondary | ICD-10-CM | POA: Diagnosis not present

## 2021-06-03 DIAGNOSIS — L298 Other pruritus: Secondary | ICD-10-CM | POA: Diagnosis not present

## 2021-06-03 DIAGNOSIS — L821 Other seborrheic keratosis: Secondary | ICD-10-CM | POA: Diagnosis not present

## 2021-06-09 ENCOUNTER — Ambulatory Visit: Payer: BC Managed Care – PPO | Admitting: Allergy & Immunology

## 2021-06-09 DIAGNOSIS — G4733 Obstructive sleep apnea (adult) (pediatric): Secondary | ICD-10-CM | POA: Diagnosis not present

## 2021-06-15 DIAGNOSIS — G4733 Obstructive sleep apnea (adult) (pediatric): Secondary | ICD-10-CM | POA: Diagnosis not present

## 2021-07-10 DIAGNOSIS — G4733 Obstructive sleep apnea (adult) (pediatric): Secondary | ICD-10-CM | POA: Diagnosis not present

## 2021-07-19 ENCOUNTER — Ambulatory Visit (INDEPENDENT_AMBULATORY_CARE_PROVIDER_SITE_OTHER): Payer: BC Managed Care – PPO | Admitting: Allergy & Immunology

## 2021-07-19 ENCOUNTER — Other Ambulatory Visit: Payer: Self-pay

## 2021-07-19 ENCOUNTER — Encounter: Payer: Self-pay | Admitting: Allergy & Immunology

## 2021-07-19 VITALS — BP 126/78 | HR 75 | Temp 97.6°F | Resp 18 | Ht 71.0 in | Wt 285.2 lb

## 2021-07-19 DIAGNOSIS — R42 Dizziness and giddiness: Secondary | ICD-10-CM | POA: Diagnosis not present

## 2021-07-19 DIAGNOSIS — G43809 Other migraine, not intractable, without status migrainosus: Secondary | ICD-10-CM

## 2021-07-19 DIAGNOSIS — R21 Rash and other nonspecific skin eruption: Secondary | ICD-10-CM | POA: Diagnosis not present

## 2021-07-19 MED ORDER — TRIAMCINOLONE ACETONIDE 0.1 % EX OINT: 1.0000 "application " | TOPICAL_OINTMENT | Freq: Two times a day (BID) | CUTANEOUS | 1 refills | Status: DC

## 2021-07-19 NOTE — Patient Instructions (Addendum)
1. Dizziness - Testing to the entire panel was negative. - Copy of testing results provided. - We could do more sensitive intradermal testing but I do not think that is worth it without the history of congestion, runny nose, or sneezing. - We could certain reconsider that in the future if needed.   2. Rash - I am unsure what this is from, but testing to the most common causes of food allergies was negative.  - Add on triamcinolone ointment 0.1% twice daily as needed (I sent in a tub of this, but use a thin layer as it goes a long way). - Do NOT use this on your face at all.   3. Return in about 6 months (around 01/16/2022).    Please inform us of any Emergency Department visits, hospitalizations, or changes in symptoms. Call us before going to the ED for breathing or allergy symptoms since we might be able to fit you in for a sick visit. Feel free to contact us anytime with any questions, problems, or concerns.  It was a pleasure to meet you today!  Websites that have reliable patient information: 1. American Academy of Asthma, Allergy, and Immunology: www.aaaai.org 2. Food Allergy Research and Education (FARE): foodallergy.org 3. Mothers of Asthmatics: http://www.asthmacommunitynetwork.org 4. American College of Allergy, Asthma, and Immunology: www.acaai.org   COVID-19 Vaccine Information can be found at: [email or web address].[email or web address] For questions related to vaccine distribution or appointments, please email [email or web address] or call [phone].   We realize that you might be concerned about having an allergic reaction to the COVID19 vaccines. To help with that concern, WE ARE OFFERING THE COVID19 VACCINES IN OUR OFFICE! Ask the front desk for dates!     Like [place] on [employer] and Instagram for our latest updates!      A healthy democracy works best when [employer] participate! Make sure you are registered to vote! If you have  moved or changed any of your contact information, you will need to get this updated before voting!  In some cases, you MAY be able to register to vote online: [email or web address]       Airborne Adult Perc - 07/19/21 1533     Time Antigen Placed 1515    Allergen Manufacturer Waynette Buttery    Location Back    Number of Test 59    1. Control-Buffer 50% Glycerol Negative    2. Control-Histamine 1 mg/ml 2+    3. Albumin saline Negative    4. Bahia Negative    5. French Southern Territories Negative    6. Johnson Negative    7. Kentucky Blue Negative    8. Meadow Fescue Negative    9. Perennial Rye Negative    10. Sweet Vernal Negative    11. Timothy Negative    12. Cocklebur Negative    13. Burweed Marshelder Negative    14. Ragweed, short Negative    15. Ragweed, Giant Negative    16. Plantain,  English Negative    17. Lamb's Quarters Negative    18. Sheep Sorrell Negative    19. Rough Pigweed Negative    20. Marsh Elder, Rough Negative    21. Mugwort, Common Negative    22. Ash mix Negative    23. Birch mix Negative    24. Beech American Negative    25. Box, Elder Negative    26. Cedar, red Negative    27. Cottonwood, Guinea-Bissau Negative    28. Elm mix Negative    29. Hickory Negative  30. Maple mix Negative    31. Oak, Guinea-[place] mix Negative    32. Pecan Pollen Negative    33. Pine mix Negative    34. Sycamore Eastern Negative    35. Walnut, Black Pollen Negative    36. Alternaria alternata Negative    37. Cladosporium Herbarum Negative    38. Aspergillus mix Negative    39. Penicillium mix Negative    40. Bipolaris sorokiniana (Helminthosporium) Negative    41. Drechslera spicifera (Curvularia) Negative    42. Mucor plumbeus Negative    43. Fusarium moniliforme Negative    44. Aureobasidium pullulans (pullulara) Negative    45. Rhizopus oryzae Negative    46. Botrytis cinera Negative    47. Epicoccum nigrum Negative    48. Phoma betae Negative    49. Candida  Albicans Negative    50. Trichophyton mentagrophytes Negative    51. Mite, D Farinae  5,000 AU/ml Negative    52. Mite, D Pteronyssinus  5,000 AU/ml Negative    53. Cat Hair 10,000 BAU/ml Negative    54.  Dog Epithelia Negative    55. Mixed Feathers Negative    56. Horse Epithelia Negative    57. Cockroach, German Negative    58. Mouse Negative    59. Tobacco Leaf Negative             Food Perc - 07/19/21 1533       Test Information   Time Antigen Placed 1515    Allergen Manufacturer Greer    Location Back    Number of allergen test 10      Food   1. Peanut Negative    2. Soybean food Negative    3. Wheat, whole Negative    4. Sesame Negative    5. Milk, cow Negative    6. Egg White, chicken Negative    7. Casein Negative    8. Shellfish mix Negative    9. Fish mix Negative    10. Cashew Negative

## 2021-07-19 NOTE — Progress Notes (Signed)
NEW PATIENT  Date of Service/Encounter:  07/19/21  Consult requested by: Marden Noble, MD   Assessment:   Dizziness  Migraines - better controlled with Maxalt and Topamax  Rash - unclear etiology  Plan/Recommendations:    1. Dizziness - Testing to the entire panel was negative. - Copy of testing results provided. - We could do more sensitive intradermal testing but I do not think that is worth it without the history of congestion, runny nose, or sneezing. - We could certain reconsider that in the future if needed.   2. Rash - I am unsure what this is from, but testing to the most common causes of food allergies was negative.  - Add on triamcinolone ointment 0.1% twice daily as needed (I sent in a tub of this, but use a thin layer as it goes a long way). - Do NOT use this on your face at all.   3. Return in about 6 months (around 01/16/2022).    This note in its entirety was forwarded to the Provider who requested this consultation.  Subjective:   Andrew Rubio is a 61 y.o. male presenting today for evaluation of  Chief Complaint  Patient presents with   Allergic Reaction    Dizziness and headaches - not sure the cause    Rash    Small rashes - possibly heat induced. Pops up on his back, chest, and abdomen     [name] has a history of the following: There are no problems to display for this patient.   History obtained from: chart review and patient.  [name] was referred by Marden Noble, MD.     Andrew Rubio is a 61 y.o. male presenting for an evaluation of rash, dizziness, and headaches .  He was having dizziness and headaches a while back. They could not figure out what was going on. Allergies was one of the causes brought up. He is currently on a migraine medication that has helped with the headaches (Topamax) and this has helped a lot. He is having some intermittent dizziness and breaking out on his neck and torso. All of these symptoms started last  year. He never saw ENT, only a Neurologist. His last migraine was sometime around 3-4 weeks ago. He does have Maxalt and he can take that when it starts coming on. Between the daily and the Maxalt, he has not had anything that has put him down as he did before starting these medications. Of note, migraines have been an ongoing issue over 10+ years.   He is having intermittent outbreaks of bumps. There is no correlation with foods. He has never been allergy tested. These are on his upper chest. There has been no drainage.   He has no postnasal drip, rhinorrhea, or sneezing. He did use Flonase in the past, but it has been a while ago. He uses some kind of nose spray to help him tolerate the CPAP at night. He does not remember the name of it. He has not needed antibiotics in several years.   He has one child who does not have allergy issues at all.   Otherwise, there is no history of other atopic diseases, including asthma, food allergies, drug allergies, stinging insect allergies, eczema, urticaria, or contact dermatitis. There is no significant infectious history. Vaccinations are up to date.    Past Medical History: There are no problems to display for this patient.   Medication List:  Allergies as of 07/19/2021  Reactions   Zocor [simvastatin]    Other reaction(s): leg and back pain        Medication List        Accurate as of July 19, 2021 11:59 PM. If you have any questions, ask your nurse or doctor.          aspirin EC 81 MG tablet Take 81 mg by mouth daily. Swallow whole.   b complex vitamins capsule Take 1 capsule by mouth daily.   D3 2000 PO Take by mouth daily.   FLUoxetine 20 MG capsule Commonly known as: PROZAC Take 40 mg by mouth daily.   fluticasone 50 MCG/ACT nasal spray Commonly known as: FLONASE Place into both nostrils daily.   hydrOXYzine 25 MG capsule Commonly known as: VISTARIL Take 3 capsules by mouth in the morning and at bedtime. ONE  TABLET IN THE AM ONE IN THE PM   Magnesium 400 MG Caps Take by mouth daily.   meloxicam 15 MG tablet Commonly known as: MOBIC TAKE ONE TABLET BY MOUTH DAILY AS NEEDED   multivitamin tablet Take 1 tablet by mouth daily.   rizatriptan 10 MG tablet Commonly known as: MAXALT Take 10 mg by mouth as needed for migraine. May repeat in 2 hours if needed   rosuvastatin 20 MG tablet Commonly known as: CRESTOR Take 20 mg by mouth at bedtime.   tamsulosin 0.4 MG Caps capsule Commonly known as: FLOMAX Take 0.4 mg by mouth.   topiramate 50 MG tablet Commonly known as: TOPAMAX Take 1 tablet (50 mg total) by mouth 2 (two) times daily.   triamcinolone ointment 0.1 % Commonly known as: KENALOG Apply 1 application topically 2 (two) times daily. Started by: Alfonse Spruce, MD   valsartan-hydrochlorothiazide 80-12.5 MG tablet Commonly known as: DIOVAN-HCT Take 1 tablet by mouth daily.   vitamin C 500 MG tablet Commonly known as: ASCORBIC ACID Take 500 mg by mouth daily.        Birth History: non-contributory  Developmental History: on-contributory  Past Surgical History: Past Surgical History:  Procedure Laterality Date   NASAL SEPTUM SURGERY     ROTATOR CUFF REPAIR     WRIST FRACTURE SURGERY Right 08/2011     Family History: Family History  Problem Relation Age of Onset   Hypertension Mother    Peripheral vascular disease Mother    Hypertension Father    Other Father        dyslipidemia   Other Sister        sepsis     Social History: Lindel lives at home with his wife.  He was in a house that was built in the 1990s.  There is wood in the main living areas.  They have gas heating and central cooling.  There are no animals inside or outside of the home.  There are dust mite covers on the bed, but not the pillows.  There is no tobacco exposure.  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  He does use a HEPA filter in the home.  They do not live near  an interstate or industrial area. He works at a company for [employer] and [employer]. This is mostly on office based job.    Review of Systems  Constitutional: Negative.  Negative for chills, fever, malaise/fatigue and weight loss.  HENT: Negative.  Negative for congestion, ear discharge and ear pain.   Eyes:  Negative for pain, discharge and redness.  Respiratory:  Negative for  cough, sputum production, shortness of breath and wheezing.   Cardiovascular: Negative.  Negative for chest pain and palpitations.  Gastrointestinal:  Negative for abdominal pain, constipation, diarrhea, heartburn, nausea and vomiting.  Skin: Negative.  Negative for itching and rash.  Neurological:  Positive for dizziness and headaches.  Endo/Heme/Allergies:  Negative for environmental allergies. Does not bruise/bleed easily.      Objective:   Blood pressure 126/78, pulse 75, temperature 97.6 F (36.4 C), resp. rate 18, height 5\' 11"  (1.803 m), weight 285 lb 3.2 oz (129.4 kg), SpO2 96 %. Body mass index is 39.78 kg/m.     Physical Exam Vitals reviewed.  Constitutional:      Appearance: He is well-developed. He is obese.     Comments: Pleasant male.  Cooperative with the exam.  HENT:     Head: Normocephalic and atraumatic.     Right Ear: Tympanic membrane, ear canal and external ear normal. No drainage, swelling or tenderness. Tympanic membrane is not injected, scarred, erythematous, retracted or bulging.     Left Ear: Tympanic membrane, ear canal and external ear normal. No drainage, swelling or tenderness. Tympanic membrane is not injected, scarred, erythematous, retracted or bulging.     Nose: Mucosal edema and rhinorrhea present. No nasal deformity or septal deviation.     Right Turbinates: Enlarged, swollen and pale.     Left Turbinates: Enlarged, swollen and pale.     Right Sinus: No maxillary sinus tenderness or frontal sinus tenderness.     Left Sinus: No maxillary sinus tenderness or  frontal sinus tenderness.     Mouth/Throat:     Mouth: Mucous membranes are not pale and not dry.     Pharynx: Uvula midline.  Eyes:     General:        Right eye: No discharge.        Left eye: No discharge.     Conjunctiva/sclera: Conjunctivae normal.     Right eye: Right conjunctiva is not injected. No chemosis.    Left eye: Left conjunctiva is not injected. No chemosis.    Pupils: Pupils are equal, round, and reactive to light.  Cardiovascular:     Rate and Rhythm: Normal rate and regular rhythm.     Heart sounds: Normal heart sounds.  Pulmonary:     Effort: Pulmonary effort is normal. No tachypnea, accessory muscle usage or respiratory distress.     Breath sounds: Normal breath sounds. No wheezing, rhonchi or rales.     Comments: Moving air well in all lung fields.  No increased work of breathing. Chest:     Chest wall: No tenderness.  Abdominal:     Tenderness: There is no abdominal tenderness. There is no guarding or rebound.  Lymphadenopathy:     Head:     Right side of head: No submandibular, tonsillar or occipital adenopathy.     Left side of head: No submandibular, tonsillar or occipital adenopathy.     Cervical: No cervical adenopathy.  Skin:    General: Skin is warm.     Capillary Refill: Capillary refill takes less than 2 seconds.     Coloration: Skin is not pale.     Findings: Rash present. No abrasion, erythema or petechiae. Rash is papular. Rash is not urticarial or vesicular.     Comments: He does have some scattered papular lesions on his upper chest.  Neurological:     Mental Status: He is alert.  Psychiatric:        Behavior:  Behavior is cooperative.     Diagnostic studies:   Allergy Studies:     Airborne Adult Perc - 07/19/21 1533     Time Antigen Placed 1515    Allergen Manufacturer Waynette ButteryGreer    Location Back    Number of Test 59    1. Control-Buffer 50% Glycerol Negative    2. Control-Histamine 1 mg/ml 2+    3. Albumin saline Negative    4.  Bahia Negative    5. French Southern TerritoriesBermuda Negative    6. Johnson Negative    7. Kentucky Blue Negative    8. Meadow Fescue Negative    9. Perennial Rye Negative    10. Sweet Vernal Negative    11. Timothy Negative    12. Cocklebur Negative    13. Burweed Marshelder Negative    14. Ragweed, short Negative    15. Ragweed, Giant Negative    16. Plantain,  English Negative    17. Lamb's Quarters Negative    18. Sheep Sorrell Negative    19. Rough Pigweed Negative    20. Marsh Elder, Rough Negative    21. Mugwort, Common Negative    22. Ash mix Negative    23. Birch mix Negative    24. Beech American Negative    25. Box, Elder Negative    26. Cedar, red Negative    27. Cottonwood, Guinea-BissauEastern Negative    28. Elm mix Negative    29. Hickory Negative    30. Maple mix Negative    31. Oak, Guinea-Bissau[place]astern mix Negative    32. Pecan Pollen Negative    33. Pine mix Negative    34. Sycamore Eastern Negative    35. Walnut, Black Pollen Negative    36. Alternaria alternata Negative    37. Cladosporium Herbarum Negative    38. Aspergillus mix Negative    39. Penicillium mix Negative    40. Bipolaris sorokiniana (Helminthosporium) Negative    41. Drechslera spicifera (Curvularia) Negative    42. Mucor plumbeus Negative    43. Fusarium moniliforme Negative    44. Aureobasidium pullulans (pullulara) Negative    45. Rhizopus oryzae Negative    46. Botrytis cinera Negative    47. Epicoccum nigrum Negative    48. Phoma betae Negative    49. Candida Albicans Negative    50. Trichophyton mentagrophytes Negative    51. Mite, D Farinae  5,000 AU/ml Negative    52. Mite, D Pteronyssinus  5,000 AU/ml Negative    53. Cat Hair 10,000 BAU/ml Negative    54.  Dog Epithelia Negative    55. Mixed Feathers Negative    56. Horse Epithelia Negative    57. Cockroach, German Negative    58. Mouse Negative    59. Tobacco Leaf Negative             Food Perc - 07/19/21 1533       Test Information   Time Antigen  Placed 1515    Allergen Manufacturer Greer    Location Back    Number of allergen test 10      Food   1. Peanut Negative    2. Soybean food Negative    3. Wheat, whole Negative    4. Sesame Negative    5. Milk, cow Negative    6. Egg White, chicken Negative    7. Casein Negative    8. Shellfish mix Negative    9. Fish mix Negative    10. Cashew Negative  Allergy testing results were read and interpreted by myself, documented by clinical staff.         Salvatore Marvel, MD [facility]

## 2021-07-21 ENCOUNTER — Other Ambulatory Visit: Payer: Self-pay | Admitting: *Deleted

## 2021-07-21 MED ORDER — TRIAMCINOLONE ACETONIDE 0.1 % EX OINT: 1.0000 "application " | TOPICAL_OINTMENT | Freq: Two times a day (BID) | CUTANEOUS | 1 refills | Status: DC

## 2021-08-08 DIAGNOSIS — G4733 Obstructive sleep apnea (adult) (pediatric): Secondary | ICD-10-CM | POA: Diagnosis not present

## 2021-08-09 ENCOUNTER — Other Ambulatory Visit: Payer: Self-pay

## 2021-08-09 ENCOUNTER — Ambulatory Visit (INDEPENDENT_AMBULATORY_CARE_PROVIDER_SITE_OTHER): Payer: BC Managed Care – PPO | Admitting: Diagnostic Neuroimaging

## 2021-08-09 ENCOUNTER — Encounter: Payer: Self-pay | Admitting: Diagnostic Neuroimaging

## 2021-08-09 VITALS — BP 149/78 | HR 76 | Ht 71.0 in | Wt 288.0 lb

## 2021-08-09 DIAGNOSIS — G43109 Migraine with aura, not intractable, without status migrainosus: Secondary | ICD-10-CM | POA: Diagnosis not present

## 2021-08-09 MED ORDER — RIZATRIPTAN BENZOATE 10 MG PO TABS: 10.0000 mg | ORAL_TABLET | ORAL | 6 refills | Status: DC | PRN

## 2021-08-09 MED ORDER — TOPIRAMATE 50 MG PO TABS: 50.0000 mg | ORAL_TABLET | Freq: Two times a day (BID) | ORAL | 4 refills | Status: DC

## 2021-08-09 NOTE — Progress Notes (Signed)
? ?[facility] ? ?PATIENT: Andrew Rubio ?DOB: 06-08-1960 ? ?REFERRING CLINICIAN: Marden Noble, MD ?HISTORY FROM: patient  ?REASON FOR VISIT: follow up  ? ? ?HISTORICAL ? ?CHIEF COMPLAINT:  ?Chief Complaint  ?Patient presents with  ? Follow-up  ?  Rm 6 alone ?Pt is well and stable, headaches are doing well and have improved since last visit   ? ? ?HISTORY OF PRESENT ILLNESS:  ? ?UPDATE (08/09/21, VRP): Since last visit, doing well. TPX helping. Now 0-1 migraine per month. Rizatriptan helps. No alleviating or aggravating factors. Tolerating meds.   ? ?PRIOR HPI (02/02/21, VRP): 61 year old male here for evaluation of headaches. ? ?Patient has had headaches since age 69 years old.  He describes crushing frontal headaches with photophobia and nausea.  Sometimes has visual aura.  He was averaging 4-5 headaches per month and treating these with rizatriptan with good results. ? ?Since 2021 patient had new type of headache with constant dull aching sensation over the right frontal region sometimes associated with sinus pressure and nausea.  Symptoms worse with standing up. ? ?Patient has history of obstructive sleep apnea on CPAP. ? ? ?REVIEW OF SYSTEMS: Full 14 system review of systems performed and negative with exception of: As per HPI. ? ?ALLERGIES: ?Allergies  ?Allergen Reactions  ? Zocor [Simvastatin]   ?  Other reaction(s): leg and back pain  ? ? ?HOME MEDICATIONS: ?Outpatient Medications Prior to Visit  ?Medication Sig Dispense Refill  ? aspirin EC 81 MG tablet Take 81 mg by mouth daily. Swallow whole.    ? b complex vitamins capsule Take 1 capsule by mouth daily.    ? Cholecalciferol (D3 2000 PO) Take by mouth daily.    ? FLUoxetine (PROZAC) 20 MG capsule Take 40 mg by mouth daily.    ? fluticasone (FLONASE) 50 MCG/ACT nasal spray Place into both nostrils daily.    ? hydrOXYzine (VISTARIL) 25 MG capsule Take 3 capsules by mouth in the morning and at bedtime. ONE TABLET IN THE AM ONE IN THE PM    ?  Magnesium 400 MG CAPS Take by mouth daily.    ? meloxicam (MOBIC) 15 MG tablet TAKE ONE TABLET BY MOUTH DAILY AS NEEDED 30 tablet 0  ? Multiple Vitamin (MULTIVITAMIN) tablet Take 1 tablet by mouth daily.    ? rosuvastatin (CRESTOR) 20 MG tablet Take 20 mg by mouth at bedtime.    ? tamsulosin (FLOMAX) 0.4 MG CAPS capsule Take 0.4 mg by mouth.    ? triamcinolone ointment (KENALOG) 0.1 % Apply 1 application topically 2 (two) times daily. 454 g 1  ? valsartan-hydrochlorothiazide (DIOVAN-HCT) 80-12.5 MG tablet Take 1 tablet by mouth daily.    ? vitamin C (ASCORBIC ACID) 500 MG tablet Take 500 mg by mouth daily.    ? rizatriptan (MAXALT) 10 MG tablet Take 10 mg by mouth as needed for migraine. May repeat in 2 hours if needed    ? topiramate (TOPAMAX) 50 MG tablet Take 1 tablet (50 mg total) by mouth 2 (two) times daily. 60 tablet 12  ? ?No facility-administered medications prior to visit.  ? ? ?PAST MEDICAL HISTORY: ?Past Medical History:  ?Diagnosis Date  ? Hyperlipidemia   ? Light headedness   ? Migraine headache   ? OSA on CPAP   ? ? ?PAST SURGICAL HISTORY: ?Past Surgical History:  ?Procedure Laterality Date  ? NASAL SEPTUM SURGERY    ? ROTATOR CUFF REPAIR    ? WRIST FRACTURE SURGERY Right 08/2011  ? ? ?  FAMILY HISTORY: ?Family History  ?Problem Relation Age of Onset  ? Hypertension Mother   ? Peripheral vascular disease Mother   ? Hypertension Father   ? Other Father   ?     dyslipidemia  ? Other Sister   ?     sepsis  ? ? ?SOCIAL HISTORY: ?Social History  ? ?Socioeconomic History  ? Marital status: Married  ?  Spouse name: Sheralyn Boatman  ? Number of children: 1  ? Years of education: Not on file  ? Highest education level: Bachelor's degree (e.g., BA, AB, BS)  ?Occupational History  ?  Comment: industrial painting  ?Tobacco Use  ? Smoking status: Never  ? Smokeless tobacco: Never  ?Vaping Use  ? Vaping Use: Never used  ?Substance and Sexual Activity  ? Alcohol use: Yes  ?  Comment: OCC  ? Drug use: No  ? Sexual activity: Not  on file  ?Other Topics Concern  ? Not on file  ?Social History Narrative  ? Caffeine- diet green tea 2 a day  ? ?Social Determinants of Health  ? ?Financial Resource Strain: Not on file  ?Food Insecurity: Not on file  ?Transportation Needs: Not on file  ?Physical Activity: Not on file  ?Stress: Not on file  ?Social Connections: Not on file  ?Intimate Partner Violence: Not on file  ? ? ? ?PHYSICAL EXAM ? ?GENERAL EXAM/CONSTITUTIONAL: ?Vitals:  ?Vitals:  ? 08/09/21 1019  ?BP: (!) 149/78  ?Pulse: 76  ?Weight: 288 lb (130.6 kg)  ?Height: 5\' 11"  (1.803 m)  ? ?Body mass index is 40.17 kg/m?. ?Wt Readings from Last 3 Encounters:  ?08/09/21 288 lb (130.6 kg)  ?07/19/21 285 lb 3.2 oz (129.4 kg)  ?05/27/21 284 lb 6.4 oz (129 kg)  ? ?Patient is in no distress; well developed, nourished and groomed; neck is supple ? ?CARDIOVASCULAR: ?Examination of carotid arteries is normal; no carotid bruits ?Regular rate and rhythm, no murmurs ?Examination of peripheral vascular system by observation and palpation is normal ? ?EYES: ?Ophthalmoscopic exam of optic discs and posterior segments is normal; no papilledema or hemorrhages ?No results found. ? ?MUSCULOSKELETAL: ?Gait, strength, tone, movements noted in Neurologic exam below ? ?NEUROLOGIC: ?MENTAL STATUS:  ?No flowsheet data found. ?awake, alert, oriented to person, place and time ?recent and remote memory intact ?normal attention and concentration ?language fluent, comprehension intact, naming intact ?fund of knowledge appropriate ? ?CRANIAL NERVE:  ?2nd - no papilledema on fundoscopic exam ?2nd, 3rd, 4th, 6th - pupils equal and reactive to light, visual fields full to confrontation, extraocular muscles intact, no nystagmus ?5th - facial sensation symmetric ?7th - facial strength symmetric ?8th - hearing intact ?9th - palate elevates symmetrically, uvula midline ?11th - shoulder shrug symmetric ?12th - tongue protrusion midline ? ?MOTOR:  ?normal bulk and tone, full strength in the  BUE, BLE ? ?SENSORY:  ?normal and symmetric to light touch, temperature, vibration ? ?COORDINATION:  ?finger-nose-finger, fine finger movements normal ? ?REFLEXES:  ?deep tendon reflexes TRACE and symmetric ? ?GAIT/STATION:  ?narrow based gait ? ? ? ? ?DIAGNOSTIC DATA (LABS, IMAGING, TESTING) ?- I reviewed patient records, labs, notes, testing and imaging myself where available. ? ?Lab Results  ?Component Value Date  ? WBC 3.8 (L) 02/27/2011  ? HGB 16.6 02/27/2011  ? HCT 47.5 02/27/2011  ? MCV 89.1 02/27/2011  ? PLT 182 02/27/2011  ? ?   ?Component Value Date/Time  ? NA 139 02/27/2011 0902  ? K 4.9 02/27/2011 0902  ? CL 104 02/27/2011  0902  ? CO2 27 02/27/2011 0902  ? GLUCOSE 78 02/27/2011 0902  ? BUN 19 02/27/2011 0902  ? CREATININE 0.96 02/27/2011 0902  ? CALCIUM 10.1 02/27/2011 0902  ? PROT 7.1 02/27/2011 0902  ? ALBUMIN 4.2 02/27/2011 0902  ? AST 29 02/27/2011 0902  ? ALT 31 02/27/2011 0902  ? ALKPHOS 72 02/27/2011 0902  ? BILITOT 1.0 02/27/2011 0902  ? GFRNONAA >90 02/27/2011 0902  ? GFRAA >90 02/27/2011 0902  ? ?No results found for: CHOL, HDL, LDLCALC, LDLDIRECT, TRIG, CHOLHDL ?No results found for: HGBA1C ?No results found for: VITAMINB12 ?No results found for: TSH ? ?03/08/21 MRI brain ?MRI of the brain with and without contrast shows the following: ?1.   Scattered punctate T2/FLAIR in the subcortical and deep white matter of the hemispheres.  This is a nonspecific finding but is most consistent with mild age-related chronic microvascular ischemic change or the sequela of migraine headaches.  None of the foci enhanced or appear to be acute. ? ? ?ASSESSMENT AND PLAN ? ?61 y.o. year old male here with history of migraine headaches are since age 61 years old, now with new type of headache starting in 2021 with right frontal pain, nausea with standing, constant pain.  We will proceed with further work-up to rule out other secondary causes.  Also will try migraine treatments. ? ? ?Dx: ? ?1. Migraine with aura  and without status migrainosus, not intractable   ? ? ? ? ? ?PLAN: ? ? ? ? ?MIGRAINE TREATMENT PLAN: ? ?MIGRAINE PREVENTION  ?LIFESTYLE CHANGES ?-Stop or avoid smoking ?-Decrease or avoid caffeine / alc

## 2021-08-14 ENCOUNTER — Other Ambulatory Visit: Payer: Self-pay

## 2021-08-14 ENCOUNTER — Ambulatory Visit (INDEPENDENT_AMBULATORY_CARE_PROVIDER_SITE_OTHER): Payer: BC Managed Care – PPO

## 2021-08-14 ENCOUNTER — Ambulatory Visit
Admission: EM | Admit: 2021-08-14 | Discharge: 2021-08-14 | Disposition: A | Discharge status: Home / Self Care | Payer: BC Managed Care – PPO | Attending: Physician Assistant | Admitting: Physician Assistant

## 2021-08-14 DIAGNOSIS — R059 Cough, unspecified: Secondary | ICD-10-CM | POA: Diagnosis not present

## 2021-08-14 DIAGNOSIS — J069 Acute upper respiratory infection, unspecified: Secondary | ICD-10-CM | POA: Diagnosis not present

## 2021-08-14 NOTE — ED Provider Notes (Signed)
?[facility] ? ? ? ?CSN: [id number] ?Arrival date & time: 08/14/21  1254 ? ? ?  ? ?History   ?Chief Complaint ?Chief Complaint  ?Patient presents with  ? Cough  ? ? ?HPI ?Bertram GalaJohn A Yeske is a 61 y.o. male.  ? ?Patient here today for evaluation of cough, congestion, sore throat, headache, wheezing and nausea that started about 4 days ago. He has not had fever that he is aware of but has had some chills at night. He does have history of pneumonia and is concerned about same. He has tried dayquil and nyquil with mild relief.  ? ?The history is provided by the patient.  ? ?Past Medical History:  ?Diagnosis Date  ? Hyperlipidemia   ? Light headedness   ? Migraine headache   ? OSA on CPAP   ? ? ?There are no problems to display for this patient. ? ? ?Past Surgical History:  ?Procedure Laterality Date  ? NASAL SEPTUM SURGERY    ? ROTATOR CUFF REPAIR    ? WRIST FRACTURE SURGERY Right 08/2011  ? ? ? ? ? ?Home Medications   ? ?Prior to Admission medications   ?Medication Sig Start Date End Date Taking? Authorizing Provider  ?aspirin EC 81 MG tablet Take 81 mg by mouth daily. Swallow whole.    [provider]  ?b complex vitamins capsule Take 1 capsule by mouth daily.    [provider]  ?Cholecalciferol (D3 2000 PO) Take by mouth daily.    [provider]  ?FLUoxetine (PROZAC) 20 MG capsule Take 40 mg by mouth daily.    [provider]  ?fluticasone (FLONASE) 50 MCG/ACT nasal spray Place into both nostrils daily.    [provider]  ?hydrOXYzine (VISTARIL) 25 MG capsule Take 3 capsules by mouth in the morning and at bedtime. ONE TABLET IN THE AM ONE IN THE PM 03/03/21   [provider]  ?Magnesium 400 MG CAPS Take by mouth daily.    [provider]  ?meloxicam (MOBIC) 15 MG tablet TAKE ONE TABLET BY MOUTH DAILY AS NEEDED 03/04/21   Vivi BarrackWagoner, Matthew R, DPM  ?Multiple Vitamin (MULTIVITAMIN) tablet Take 1 tablet by mouth daily.    [provider]   ?rizatriptan (MAXALT) 10 MG tablet Take 1 tablet (10 mg total) by mouth as needed for migraine. May repeat in 2 hours if needed 08/09/21   Penumalli, Glenford BayleyVikram R, MD  ?rosuvastatin (CRESTOR) 20 MG tablet Take 20 mg by mouth at bedtime. 01/03/21   [provider]  ?tamsulosin (FLOMAX) 0.4 MG CAPS capsule Take 0.4 mg by mouth.    [provider]  ?topiramate (TOPAMAX) 50 MG tablet Take 1 tablet (50 mg total) by mouth 2 (two) times daily. 08/09/21 11/02/22  Penumalli, Glenford BayleyVikram R, MD  ?triamcinolone ointment (KENALOG) 0.1 % Apply 1 application topically 2 (two) times daily. 07/21/21   Alfonse SpruceGallagher, Joel Louis, MD  ?valsartan-hydrochlorothiazide (DIOVAN-HCT) 80-12.5 MG tablet Take 1 tablet by mouth daily. 10/06/20   [provider]  ?vitamin C (ASCORBIC ACID) 500 MG tablet Take 500 mg by mouth daily.    [provider]  ? ? ?Family History ?Family History  ?Problem Relation Age of Onset  ? Hypertension Mother   ? Peripheral vascular disease Mother   ? Hypertension Father   ? Other Father   ?     dyslipidemia  ? Other Sister   ?     sepsis  ? ? ?Social History ?Social History  ? ?  Tobacco Use  ? Smoking status: Never  ? Smokeless tobacco: Never  ?Vaping Use  ? Vaping Use: Never used  ?Substance Use Topics  ? Alcohol use: Yes  ?  Comment: OCC  ? Drug use: No  ? ? ? ?Allergies   ?Zocor [simvastatin] ? ? ?Review of Systems ?Review of Systems  ?Constitutional:  Positive for chills. Negative for fever.  ?HENT:  Positive for congestion and sore throat. Negative for ear pain.   ?Eyes:  Negative for discharge and redness.  ?Respiratory:  Positive for cough. Negative for shortness of breath.   ?Gastrointestinal:  Positive for nausea. Negative for abdominal pain, constipation, diarrhea and vomiting.  ? ? ?Physical Exam ?Triage Vital Signs ?ED Triage Vitals  ?Enc Vitals Group  ?   BP   ?   Pulse   ?   Resp   ?   Temp   ?   Temp src   ?   SpO2   ?   Weight   ?   Height   ?   Head Circumference   ?   Peak Flow   ?    Pain Score   ?   Pain Loc   ?   Pain Edu?   ?   Excl. in GC?   ? ?No data found. ? ?Updated Vital Signs ?BP 112/77 (BP Location: Left Arm)   Pulse 86   Temp 98.2 ?F (36.8 ?C) (Oral)   Resp 18   SpO2 98%  ?   ? ?Physical Exam ?Vitals and nursing note reviewed.  ?Constitutional:   ?   General: He is not in acute distress. ?   Appearance: Normal appearance. He is not ill-appearing.  ?HENT:  ?   Head: Normocephalic and atraumatic.  ?   Nose: Congestion present.  ?   Mouth/Throat:  ?   Mouth: Mucous membranes are moist.  ?   Pharynx: Oropharynx is clear. No oropharyngeal exudate or posterior oropharyngeal erythema.  ?Eyes:  ?   Conjunctiva/sclera: Conjunctivae normal.  ?Cardiovascular:  ?   Rate and Rhythm: Normal rate and regular rhythm.  ?   Heart sounds: Normal heart sounds. No murmur heard. ?Pulmonary:  ?   Effort: Pulmonary effort is normal. No respiratory distress.  ?   Breath sounds: Normal breath sounds. No wheezing, rhonchi or rales.  ?Skin: ?   General: Skin is warm and dry.  ?Neurological:  ?   Mental Status: He is alert.  ?Psychiatric:     ?   Mood and Affect: Mood normal.     ?   Thought Content: Thought content normal.  ? ? ? ?UC Treatments / Results  ?Labs ?(all labs ordered are listed, but only abnormal results are displayed) ?Labs Reviewed  ?COVID-19, FLU A+B AND RSV  ? ? ?EKG ? ? ?Radiology ?DG Chest 2 View ? ?Result Date: 08/14/2021 ?CLINICAL DATA:  Cough for the past 2 days. EXAM: CHEST - 2 VIEW COMPARISON:  None. FINDINGS: The heart size and mediastinal contours are within normal limits. Both lungs are clear. The visualized skeletal structures are unremarkable. IMPRESSION: No active cardiopulmonary disease. Electronically Signed   By: Obie Dredge M.D.   On: 08/14/2021 13:17   ? ?Procedures ?Procedures (including critical care time) ? ?Medications Ordered in UC ?Medications - No data to display ? ?Initial Impression / Assessment and Plan / UC Course  ?I have reviewed the triage vital signs and  the nursing notes. ? ?Pertinent labs & imaging results that  were available during my care of the patient were reviewed by me and considered in my medical decision making (see chart for details). ? ?  ?Suspect viral etiology of symptoms. Will order covid, flu and rsv screening. CXR WNL. Encouraged follow up with any further concerns.  ? ?Final Clinical Impressions(s) / UC Diagnoses  ? ?Final diagnoses:  ?Acute upper respiratory infection  ? ?Discharge Instructions   ?None ?  ? ?ED Prescriptions   ?None ?  ? ?PDMP not reviewed this encounter. ?  ?Tomi Bamberger, PA-C ?08/14/21 1409 ? ?

## 2021-08-14 NOTE — ED Triage Notes (Signed)
Pt c/o sore throat, cough, headache, wheezing, nasal congestion, nausea,  ? ?Denies earache/pressure, vomiting, diarrhea, constipation  ? ?Onset ~ thurs. States hx of PNA.  ?

## 2021-08-15 LAB — COVID-19, FLU A+B AND RSV
Influenza A, NAA: NOT DETECTED
Influenza B, NAA: NOT DETECTED
RSV, NAA: NOT DETECTED
SARS-CoV-2, NAA: NOT DETECTED

## 2021-09-08 DIAGNOSIS — G4733 Obstructive sleep apnea (adult) (pediatric): Secondary | ICD-10-CM | POA: Diagnosis not present

## 2021-09-14 DIAGNOSIS — F411 Generalized anxiety disorder: Secondary | ICD-10-CM | POA: Diagnosis not present

## 2021-09-14 DIAGNOSIS — F331 Major depressive disorder, recurrent, moderate: Secondary | ICD-10-CM | POA: Diagnosis not present

## 2021-09-14 DIAGNOSIS — F605 Obsessive-compulsive personality disorder: Secondary | ICD-10-CM | POA: Diagnosis not present

## 2021-10-08 DIAGNOSIS — G4733 Obstructive sleep apnea (adult) (pediatric): Secondary | ICD-10-CM | POA: Diagnosis not present

## 2021-11-07 DIAGNOSIS — G4733 Obstructive sleep apnea (adult) (pediatric): Secondary | ICD-10-CM | POA: Diagnosis not present

## 2021-11-08 DIAGNOSIS — G4733 Obstructive sleep apnea (adult) (pediatric): Secondary | ICD-10-CM | POA: Diagnosis not present

## 2021-11-09 DIAGNOSIS — H35371 Puckering of macula, right eye: Secondary | ICD-10-CM | POA: Diagnosis not present

## 2021-11-09 DIAGNOSIS — H2513 Age-related nuclear cataract, bilateral: Secondary | ICD-10-CM | POA: Diagnosis not present

## 2021-11-09 DIAGNOSIS — H52223 Regular astigmatism, bilateral: Secondary | ICD-10-CM | POA: Diagnosis not present

## 2021-11-09 DIAGNOSIS — H04123 Dry eye syndrome of bilateral lacrimal glands: Secondary | ICD-10-CM | POA: Diagnosis not present

## 2021-11-09 DIAGNOSIS — H524 Presbyopia: Secondary | ICD-10-CM | POA: Diagnosis not present

## 2021-11-09 DIAGNOSIS — H5203 Hypermetropia, bilateral: Secondary | ICD-10-CM | POA: Diagnosis not present

## 2021-11-18 ENCOUNTER — Inpatient Hospital Stay
Admit: 2021-11-18 | Discharge: 2021-11-18 | Disposition: A | Discharge status: Home / Self Care | Payer: BLUE CROSS/BLUE SHIELD | Attending: Emergency Medicine

## 2021-11-18 ENCOUNTER — Emergency Department: Admit: 2021-11-18 | Payer: BLUE CROSS/BLUE SHIELD | Primary: Diagnostic Radiology

## 2021-11-18 DIAGNOSIS — S2242XA Multiple fractures of ribs, left side, initial encounter for closed fracture: Secondary | ICD-10-CM

## 2021-11-18 DIAGNOSIS — W19XXXA Unspecified fall, initial encounter: Secondary | ICD-10-CM

## 2021-11-18 DIAGNOSIS — S32048A Other fracture of fourth lumbar vertebra, initial encounter for closed fracture: Secondary | ICD-10-CM | POA: Diagnosis not present

## 2021-11-18 DIAGNOSIS — R937 Abnormal findings on diagnostic imaging of other parts of musculoskeletal system: Secondary | ICD-10-CM | POA: Diagnosis not present

## 2021-11-18 DIAGNOSIS — M546 Pain in thoracic spine: Secondary | ICD-10-CM | POA: Diagnosis not present

## 2021-11-18 DIAGNOSIS — S79912A Unspecified injury of left hip, initial encounter: Secondary | ICD-10-CM | POA: Diagnosis not present

## 2021-11-18 DIAGNOSIS — Y9289 Other specified places as the place of occurrence of the external cause: Secondary | ICD-10-CM | POA: Diagnosis not present

## 2021-11-18 DIAGNOSIS — R0781 Pleurodynia: Secondary | ICD-10-CM | POA: Diagnosis not present

## 2021-11-18 DIAGNOSIS — W11XXXA Fall on and from ladder, initial encounter: Secondary | ICD-10-CM | POA: Diagnosis not present

## 2021-11-18 DIAGNOSIS — S32009A Unspecified fracture of unspecified lumbar vertebra, initial encounter for closed fracture: Secondary | ICD-10-CM | POA: Diagnosis not present

## 2021-11-18 DIAGNOSIS — S32019A Unspecified fracture of first lumbar vertebra, initial encounter for closed fracture: Secondary | ICD-10-CM | POA: Diagnosis not present

## 2021-11-18 MED ORDER — KETOROLAC TROMETHAMINE 30 MG/ML IJ SOLN
30 MG/ML | Freq: Once | INTRAMUSCULAR | Status: AC
Start: 2021-11-18 — End: 2021-11-18
  Administered 2021-11-18: 19:00:00 30 mg via INTRAMUSCULAR

## 2021-11-18 MED ORDER — HYDROCODONE-ACETAMINOPHEN 5-325 MG PO TABS
5-325 MG | ORAL_TABLET | ORAL | 0 refills | Status: AC | PRN
Start: 2021-11-18 — End: 2021-11-21

## 2021-11-18 MED ORDER — HYDROCODONE-ACETAMINOPHEN 5-325 MG PO TABS
5-325 MG | ORAL | Status: AC
Start: 2021-11-18 — End: 2021-11-18
  Administered 2021-11-18: 20:00:00 1 via ORAL

## 2021-11-18 MED FILL — KETOROLAC TROMETHAMINE 30 MG/ML IJ SOLN: 30 MG/ML | INTRAMUSCULAR | Qty: 1

## 2021-11-18 MED FILL — HYDROCODONE-ACETAMINOPHEN 5-325 MG PO TABS: 5-325 MG | ORAL | Qty: 1

## 2021-11-18 NOTE — Discharge Instructions (Signed)
Contact your Dr for follow-up and bone scan

## 2021-11-18 NOTE — ED Provider Notes (Addendum)
[facility] EMERGENCY DEPT  EMERGENCY DEPARTMENT ENCOUNTER      Pt Name: Carl Peters  MRN: [id number]  Birthdate 1960-10-25  Date of evaluation: 11/18/2021  Provider: Elpidio Galea, MD    CHIEF COMPLAINT       Chief Complaint   Patient presents with    Fall     Pt states he was on a ladder and fell back from about 6 feet up, he landed on his left side/back area, C/O back and rib pain. Denies head injury, LOC and thinners. A&Ox4    Back Pain    Rib Pain (injury)         HISTORY OF PRESENT ILLNESS   (Location/Symptom, Timing/Onset, Context/Setting, Quality, Duration, Modifying Factors, Severity)  Note limiting factors.   Carl Peters is a 61 y.o. male who presents to the emergency department     Pt says he fell approximately 6 ft off a ladder PTA. He says he landed on his L back and L hip. He denies hitting his head or LOC. Denies neck pain. He c/o pain in L posterior ribs that is worse with movement or deep inspiration. He denies dyspnea or chest pain. He denies midline back or neck pain. He also c/o pain in L posterior hip but has been ambulatory since the fall.  He denies weakness or numbness, no B/B incontinence. No abdominal pain. He denies any other injury or pain    The history is provided by the patient.   Fall  The accident occurred 1 to 2 hours ago. The fall occurred from a ladder. He fell from a height of 6 to 10 ft. There was no blood loss. The pain is present in the left hip. The pain is moderate. He was Ambulatory at the scene. There was No alcohol use involved in the accident. Pertinent negatives include no numbness, no bowel incontinence, no nausea, no vomiting, no headaches, no loss of consciousness and no tingling. The symptoms are aggravated by sitting. He has tried nothing for the symptoms.   Back Pain  Associated symptoms: no bowel incontinence, no headaches, no numbness and no tingling      Nursing Notes were reviewed.    REVIEW OF SYSTEMS    (2-9 systems for level 4, 10 or more for level 5)     Review of  Systems   Constitutional: Negative.    Respiratory: Negative.     Cardiovascular: Negative.    Gastrointestinal: Negative.  Negative for bowel incontinence, nausea and vomiting.   Genitourinary: Negative.    Musculoskeletal:  Positive for back pain.   Skin: Negative.    Neurological: Negative.  Negative for tingling, loss of consciousness, numbness and headaches.     Except as noted above the remainder of the review of systems was reviewed and negative.       PAST MEDICAL HISTORY   No past medical history on file.      SURGICAL HISTORY     No past surgical history on file.      CURRENT MEDICATIONS       Previous Medications    No medications on file       ALLERGIES     Patient has no known allergies.    FAMILY HISTORY     No family history on file.       SOCIAL HISTORY          SCREENINGS         Glasgow Coma Scale  Eye Opening: Spontaneous  Best Verbal Response: Oriented  Best Motor Response: Obeys commands  Glasgow Coma Scale Score: 15                     CIWA Assessment  BP: 132/84  Pulse: 76                 PHYSICAL EXAM    (up to 7 for level 4, 8 or more for level 5)     ED Triage Vitals [11/18/21 1338]   BP Temp Temp Source Pulse Respirations SpO2 Height Weight - Scale   132/84 97.1 F (36.2 C) Oral 76 20 100 % 5\' 11"  (1.803 m) 270 lb (122.5 kg)       Physical Exam  Vitals and nursing note reviewed.   Constitutional:       General: He is not in acute distress.     Appearance: Normal appearance. He is not ill-appearing or toxic-appearing.   HENT:      Head: Normocephalic and atraumatic.   Cardiovascular:      Rate and Rhythm: Normal rate and regular rhythm.      Heart sounds: Normal heart sounds.   Pulmonary:      Effort: Pulmonary effort is normal. No respiratory distress.      Breath sounds: Normal breath sounds. No wheezing.   Abdominal:      Palpations: Abdomen is soft.      Tenderness: There is no abdominal tenderness. There is no guarding.   Musculoskeletal:         General: No swelling or tenderness (L  posterior ribs tender, no crepitus palpable. L posterior hip mildly tender. No midline tenderness of C, T or L spine). Normal range of motion.      Cervical back: Normal range of motion and neck supple. No tenderness.   Skin:     General: Skin is warm and dry.      Findings: No bruising or lesion.   Neurological:      General: No focal deficit present.      Mental Status: He is alert.      Motor: No weakness.      Gait: Gait normal.   Psychiatric:         Mood and Affect: Mood normal.         Behavior: Behavior normal.       DIAGNOSTIC RESULTS     EKG: All EKG's are interpreted by the Emergency Department Physician who either signs or Co-signs this chart in the absence of a cardiologist.      RADIOLOGY:   Non-plain film images such as CT, Ultrasound and MRI are read by the radiologist. Plain radiographic images are visualized and preliminarily interpreted by the emergency physician with the below findings:      Interpretation per the Radiologist below, if available at the time of this note:    XR RIBS LEFT INCLUDE CHEST (MIN 3 VIEWS)   Final Result   Negative for left rib fracture or complication thereof.      XR HIP LEFT (2-3 VIEWS)   Final Result   Normal.      CT THORACIC SPINE WO CONTRAST    (Results Pending)         ED BEDSIDE ULTRASOUND:   Performed by ED Physician - none    LABS:  Labs Reviewed - No data to display    All other labs were within normal range or not returned as of this dictation.  EMERGENCY DEPARTMENT COURSE and DIFFERENTIAL DIAGNOSIS/MDM:   Vitals:    Vitals:    11/18/21 1338   BP: 132/84   Pulse: 76   Resp: 20   Temp: 97.1 F (36.2 C)   TempSrc: Oral   SpO2: 100%   Weight: 122.5 kg   Height: 5\' 11"  (1.803 m)           Medical Decision Making  Pt sustained a fall from a ladder with posterior rib and L spine transverse process fractures. Pt lives in and is going home tomorrow and I've instructed him to F/U with his PCP to arrange bone scan and further W/U of this lesion seen on CT of his  L3 body. Copy of the final read and disk have been provided.  Of note, pt is a non-smoker, denies night sweats or unexplained weight loss. Denies cough    Amount and/or Complexity of Data Reviewed  Radiology: ordered.     Details: CT T spine reveals ND fractures of posterior L ribs 11 and 12 as well as L transverse processes of L1-L4. Lesion on L3 concerning for blastic lesion may be hemangioma but bone scan is recommended for further evaluation. Pt and his son have been informed about these findings.    Risk  Prescription drug management.            REASSESSMENT          CRITICAL CARE TIME   Total Critical Care time was minutes, excluding separately reportable procedures.  There was a high probability of clinically significant/life threatening deterioration in the patient's condition which required my urgent intervention.     CONSULTS:  None    PROCEDURES:  Unless otherwise noted below, none     Procedures      FINAL IMPRESSION    No diagnosis found.      DISPOSITION/PLAN   DISPOSITION        PATIENT REFERRED TO:  No follow-up provider specified.    DISCHARGE MEDICATIONS:  New Prescriptions    No medications on file     Controlled Substances Monitoring:     No flowsheet data found.    (Please note that portions of this note were completed with a voice recognition program.  Efforts were made to edit the dictations but occasionally words are mis-transcribed.)    [place], MD (electronically signed)  Attending Emergency Physician             Elpidio Galea, MD  11/18/21 1515       11/20/21, MD  11/18/21 1538

## 2021-11-21 DIAGNOSIS — Z125 Encounter for screening for malignant neoplasm of prostate: Secondary | ICD-10-CM | POA: Diagnosis not present

## 2021-11-21 DIAGNOSIS — S2242XD Multiple fractures of ribs, left side, subsequent encounter for fracture with routine healing: Secondary | ICD-10-CM | POA: Diagnosis not present

## 2021-11-21 DIAGNOSIS — M899 Disorder of bone, unspecified: Secondary | ICD-10-CM | POA: Diagnosis not present

## 2021-11-21 DIAGNOSIS — R972 Elevated prostate specific antigen [PSA]: Secondary | ICD-10-CM | POA: Diagnosis not present

## 2021-11-30 DIAGNOSIS — Z13820 Encounter for screening for osteoporosis: Secondary | ICD-10-CM | POA: Diagnosis not present

## 2021-12-07 DIAGNOSIS — G4733 Obstructive sleep apnea (adult) (pediatric): Secondary | ICD-10-CM | POA: Diagnosis not present

## 2022-01-07 DIAGNOSIS — G4733 Obstructive sleep apnea (adult) (pediatric): Secondary | ICD-10-CM | POA: Diagnosis not present

## 2022-01-17 ENCOUNTER — Ambulatory Visit: Payer: BC Managed Care – PPO | Admitting: Allergy & Immunology

## 2022-01-26 DIAGNOSIS — F341 Dysthymic disorder: Secondary | ICD-10-CM | POA: Diagnosis not present

## 2022-01-26 DIAGNOSIS — Z0000 Encounter for general adult medical examination without abnormal findings: Secondary | ICD-10-CM | POA: Diagnosis not present

## 2022-01-26 DIAGNOSIS — E291 Testicular hypofunction: Secondary | ICD-10-CM | POA: Diagnosis not present

## 2022-01-26 DIAGNOSIS — E782 Mixed hyperlipidemia: Secondary | ICD-10-CM | POA: Diagnosis not present

## 2022-01-26 DIAGNOSIS — I10 Essential (primary) hypertension: Secondary | ICD-10-CM | POA: Diagnosis not present

## 2022-01-26 DIAGNOSIS — Z125 Encounter for screening for malignant neoplasm of prostate: Secondary | ICD-10-CM | POA: Diagnosis not present

## 2022-01-31 DIAGNOSIS — R3915 Urgency of urination: Secondary | ICD-10-CM | POA: Diagnosis not present

## 2022-01-31 DIAGNOSIS — R350 Frequency of micturition: Secondary | ICD-10-CM | POA: Diagnosis not present

## 2022-01-31 DIAGNOSIS — R972 Elevated prostate specific antigen [PSA]: Secondary | ICD-10-CM | POA: Diagnosis not present

## 2022-02-21 DIAGNOSIS — G4733 Obstructive sleep apnea (adult) (pediatric): Secondary | ICD-10-CM | POA: Diagnosis not present

## 2022-02-28 NOTE — Telephone Encounter (Signed)
[facility] ED F/U PROJECT- [place] out of [place] and was advised to follow up with primary care provider in [place]. Please do not contact patient for a PCP here as he does not live in this state. Thank you

## 2022-03-14 DIAGNOSIS — R972 Elevated prostate specific antigen [PSA]: Secondary | ICD-10-CM | POA: Diagnosis not present

## 2022-03-15 DIAGNOSIS — F411 Generalized anxiety disorder: Secondary | ICD-10-CM | POA: Diagnosis not present

## 2022-03-15 DIAGNOSIS — F331 Major depressive disorder, recurrent, moderate: Secondary | ICD-10-CM | POA: Diagnosis not present

## 2022-03-15 DIAGNOSIS — F605 Obsessive-compulsive personality disorder: Secondary | ICD-10-CM | POA: Diagnosis not present

## 2022-03-23 DIAGNOSIS — G4733 Obstructive sleep apnea (adult) (pediatric): Secondary | ICD-10-CM | POA: Diagnosis not present

## 2022-03-24 DIAGNOSIS — N401 Enlarged prostate with lower urinary tract symptoms: Secondary | ICD-10-CM | POA: Diagnosis not present

## 2022-03-24 DIAGNOSIS — R972 Elevated prostate specific antigen [PSA]: Secondary | ICD-10-CM | POA: Diagnosis not present

## 2022-03-24 DIAGNOSIS — N138 Other obstructive and reflux uropathy: Secondary | ICD-10-CM | POA: Diagnosis not present

## 2022-03-24 DIAGNOSIS — E6609 Other obesity due to excess calories: Secondary | ICD-10-CM | POA: Diagnosis not present

## 2022-04-02 IMAGING — DX DG CHEST 2V
2 series · 2 of 2 positions shown · non-contrast
Comparison: None.

CLINICAL DATA: Cough for the past 2 days.

EXAM:
CHEST - 2 VIEW

[chest pa]
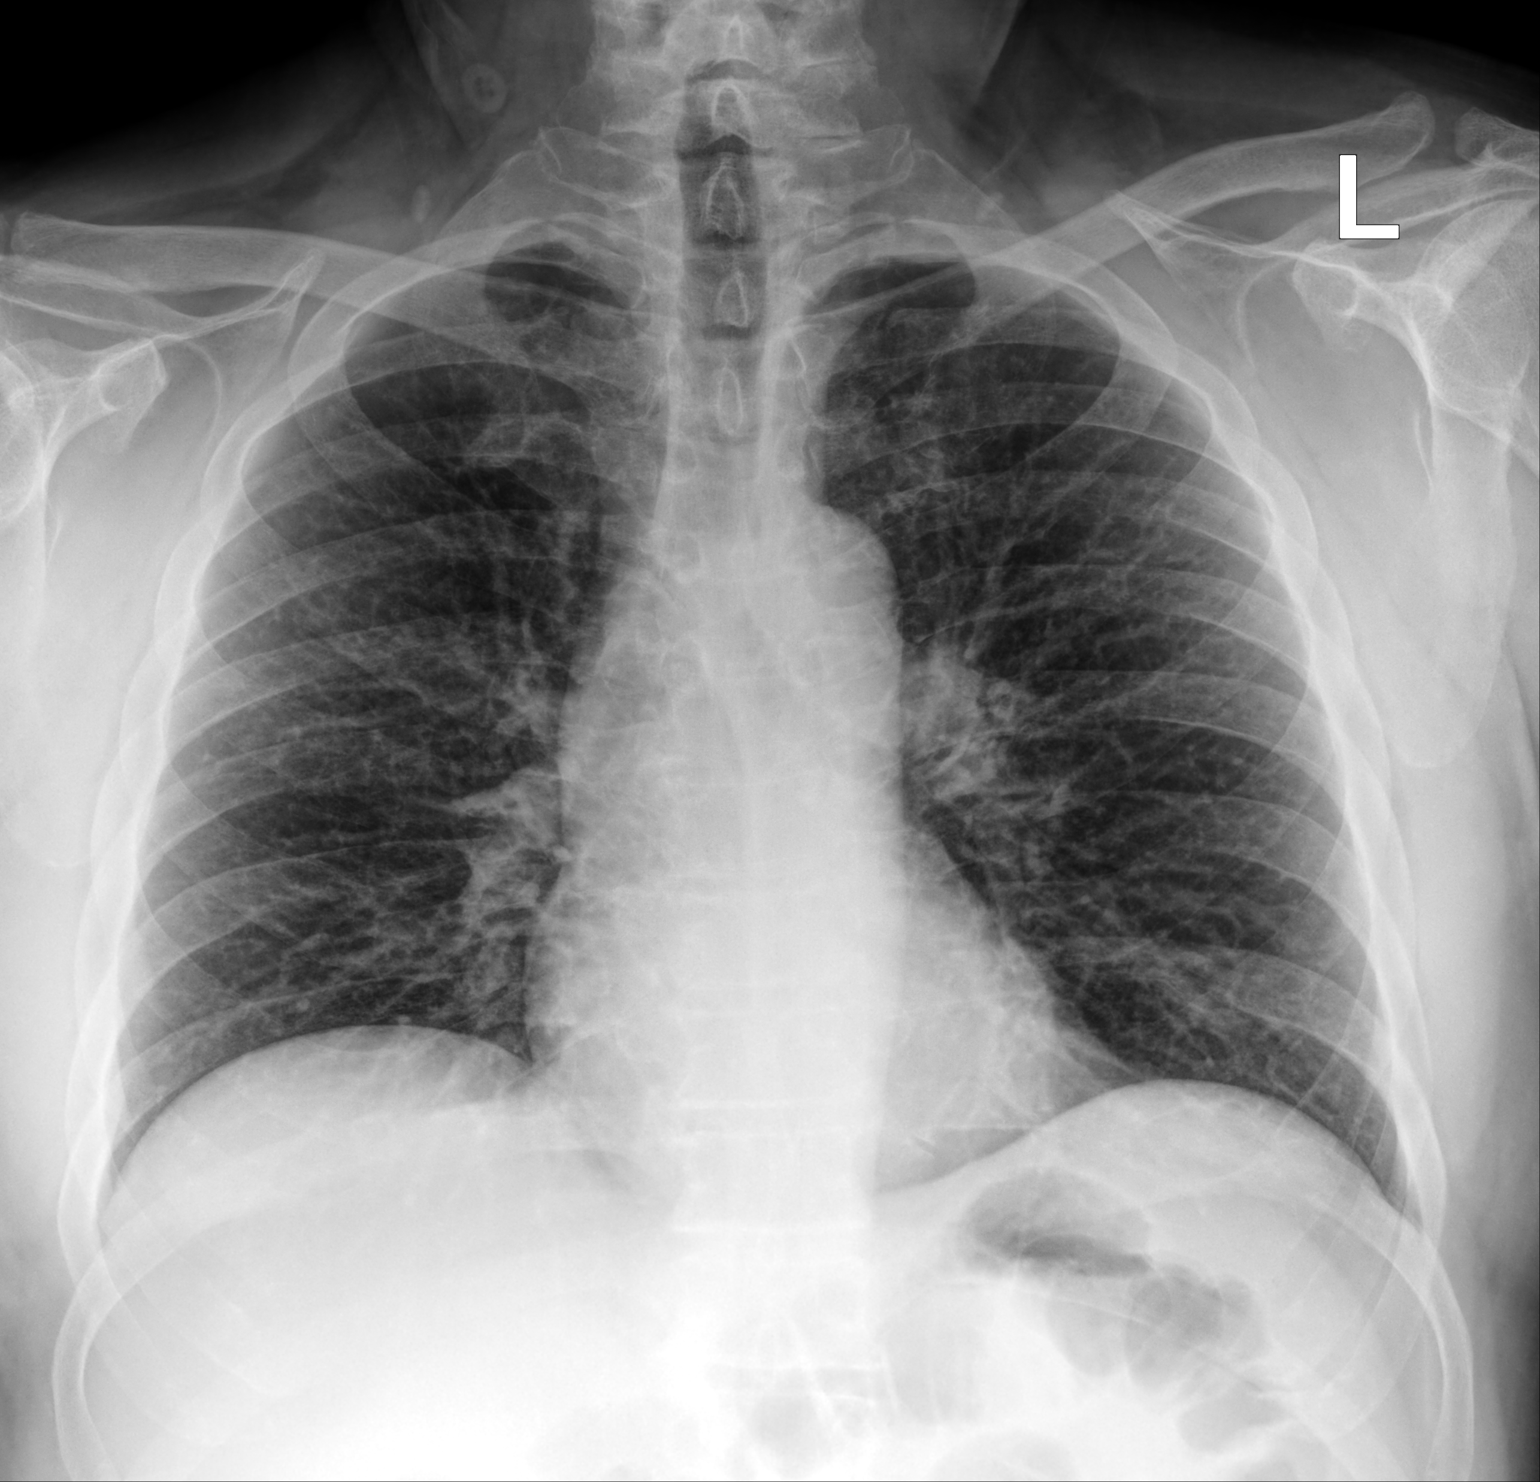

[chest lat]
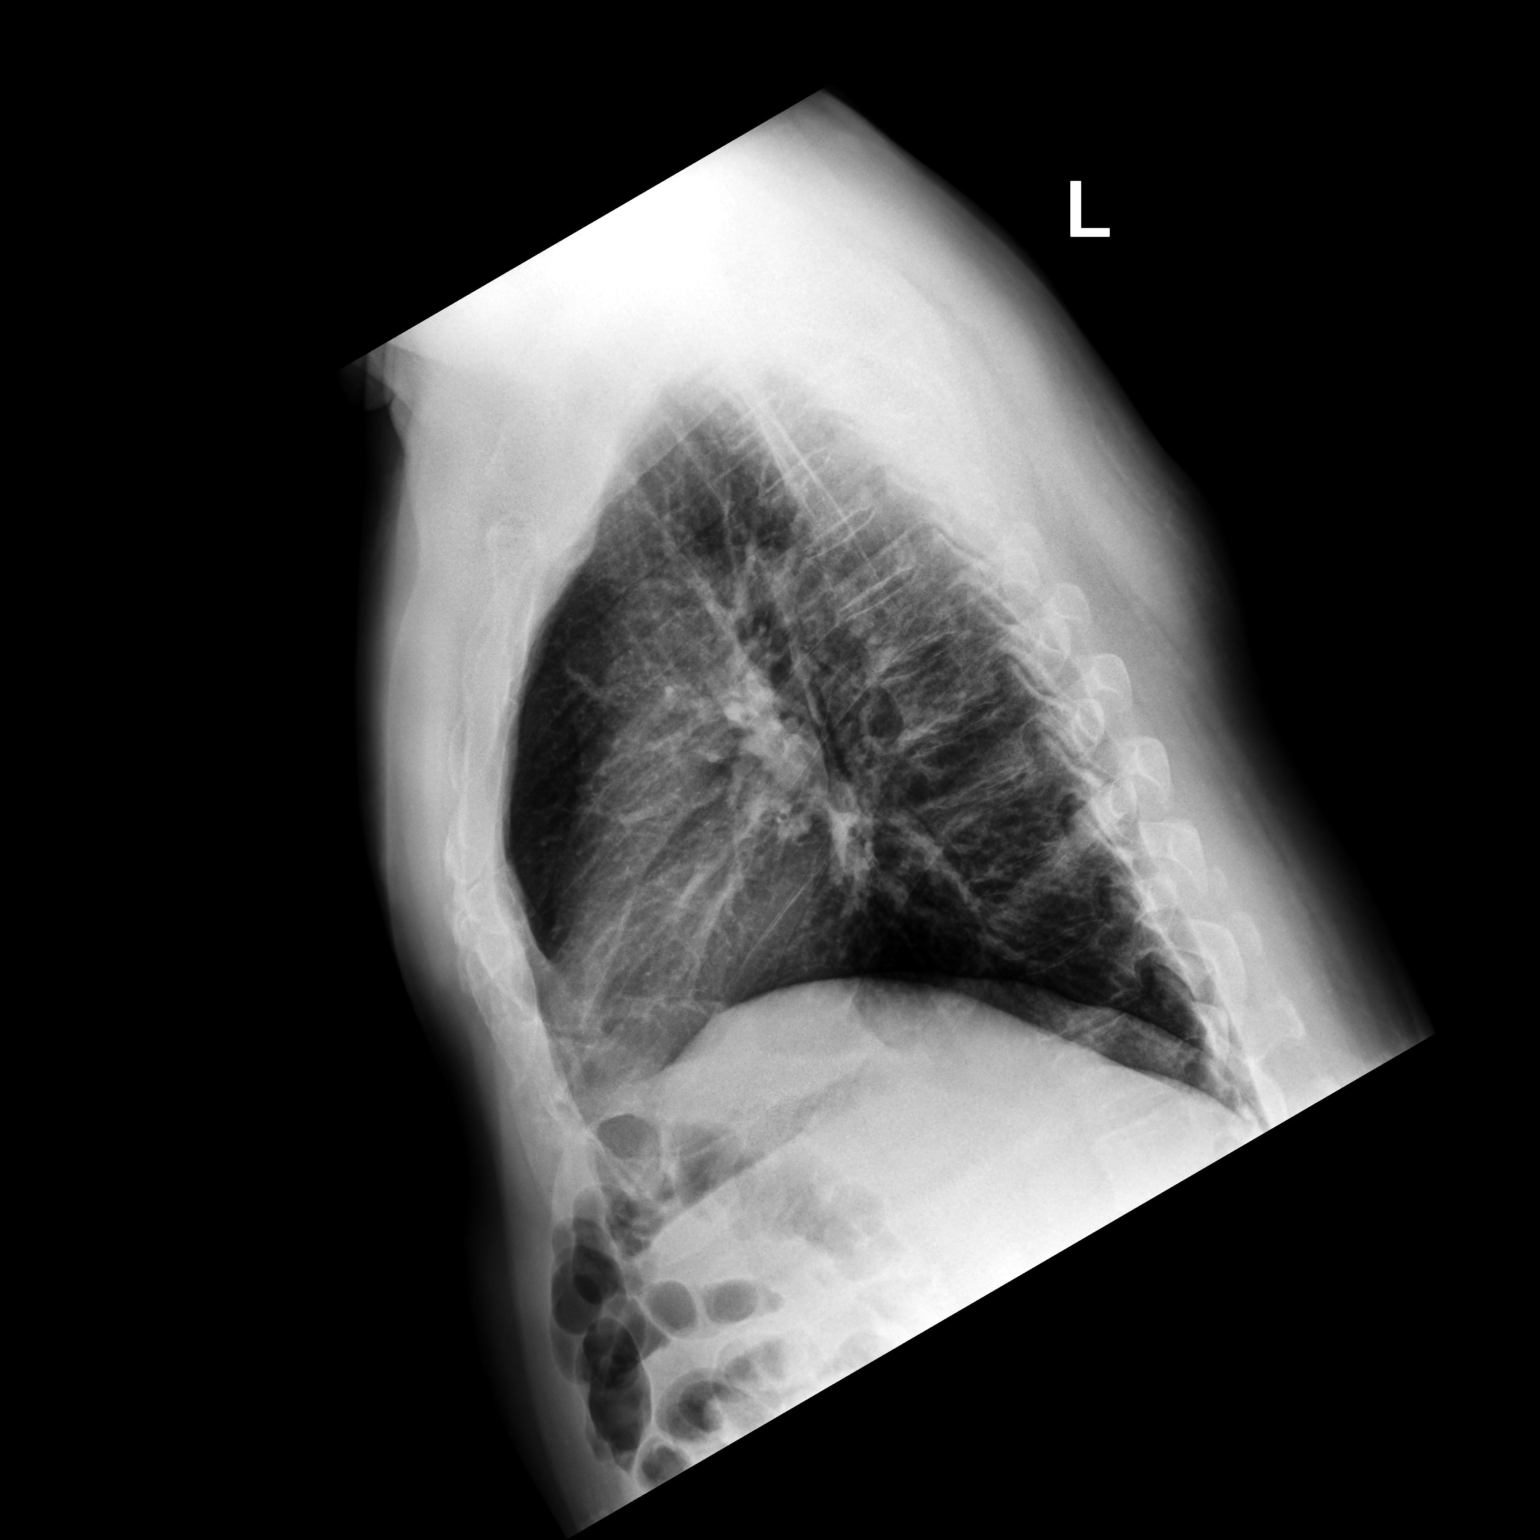

[2 of 2 positions shown; findings below may reference images not displayed]

FINDINGS: The heart size and mediastinal contours are within normal limits.
Both lungs are clear. The visualized skeletal structures are
unremarkable.
IMPRESSION: No active cardiopulmonary disease.

## 2022-04-23 DIAGNOSIS — G4733 Obstructive sleep apnea (adult) (pediatric): Secondary | ICD-10-CM | POA: Diagnosis not present

## 2022-07-18 DIAGNOSIS — H2513 Age-related nuclear cataract, bilateral: Secondary | ICD-10-CM | POA: Diagnosis not present

## 2022-07-18 DIAGNOSIS — H35073 Retinal telangiectasis, bilateral: Secondary | ICD-10-CM | POA: Diagnosis not present

## 2022-08-14 ENCOUNTER — Encounter: Payer: Self-pay | Admitting: Diagnostic Neuroimaging

## 2022-08-14 ENCOUNTER — Telehealth: Payer: BC Managed Care – PPO | Admitting: Diagnostic Neuroimaging

## 2022-08-14 ENCOUNTER — Telehealth (INDEPENDENT_AMBULATORY_CARE_PROVIDER_SITE_OTHER): Payer: BC Managed Care – PPO | Admitting: Diagnostic Neuroimaging

## 2022-08-14 DIAGNOSIS — G43109 Migraine with aura, not intractable, without status migrainosus: Secondary | ICD-10-CM | POA: Diagnosis not present

## 2022-08-14 MED ORDER — TOPIRAMATE 50 MG PO TABS: 50.0000 mg | ORAL_TABLET | Freq: Two times a day (BID) | ORAL | 4 refills | Status: DC

## 2022-08-14 MED ORDER — RIZATRIPTAN BENZOATE 10 MG PO TABS: 10.0000 mg | ORAL_TABLET | ORAL | 6 refills | Status: AC | PRN

## 2022-08-14 NOTE — Progress Notes (Signed)
[facility]  PATIENT: Andrew Rubio DOB: 06-11-1960  REFERRING CLINICIAN: Josetta Huddle, MD HISTORY FROM: patient  REASON FOR VISIT: follow up    HISTORICAL  CHIEF COMPLAINT:  Chief Complaint  Patient presents with   Migraine    HISTORY OF PRESENT ILLNESS:   UPDATE (08/14/22, VRP): Since last visit, doing well. Symptoms are stable. HA are minimal. No major HA. Rare mild HA. ~ 30migraine per month (mild).  UPDATE (08/09/21, VRP): Since last visit, doing well. TPX helping. Now 0-1 migraine per month. Rizatriptan helps. No alleviating or aggravating factors. Tolerating meds.    PRIOR HPI (02/02/21, VRP): 62 year old male here for evaluation of headaches.  Patient has had headaches since age 37 years old.  He describes crushing frontal headaches with photophobia and nausea.  Sometimes has visual aura.  He was averaging 4-5 headaches per month and treating these with rizatriptan with good results.  Since 2021 patient had new type of headache with constant dull aching sensation over the right frontal region sometimes associated with sinus pressure and nausea.  Symptoms worse with standing up.  Patient has history of obstructive sleep apnea on CPAP.   REVIEW OF SYSTEMS: Full 14 system review of systems performed and negative with exception of: As per HPI.  ALLERGIES: Allergies  Allergen Reactions   Zocor [Simvastatin]     Other reaction(s): leg and back pain    HOME MEDICATIONS: Outpatient Medications Prior to Visit  Medication Sig Dispense Refill   aspirin EC 81 MG tablet Take 81 mg by mouth daily. Swallow whole.     b complex vitamins capsule Take 1 capsule by mouth daily.     Cholecalciferol (D3 2000 PO) Take by mouth daily.     FLUoxetine (PROZAC) 20 MG capsule Take 40 mg by mouth daily.     fluticasone (FLONASE) 50 MCG/ACT nasal spray Place into both nostrils daily.     Magnesium 400 MG CAPS Take by mouth daily.     meloxicam (MOBIC) 15 MG tablet TAKE  ONE TABLET BY MOUTH DAILY AS NEEDED 30 tablet 0   Multiple Vitamin (MULTIVITAMIN) tablet Take 1 tablet by mouth daily.     rosuvastatin (CRESTOR) 20 MG tablet Take 20 mg by mouth at bedtime.     tamsulosin (FLOMAX) 0.4 MG CAPS capsule Take 0.4 mg by mouth.     valsartan-hydrochlorothiazide (DIOVAN-HCT) 80-12.5 MG tablet Take 1 tablet by mouth daily.     vitamin C (ASCORBIC ACID) 500 MG tablet Take 500 mg by mouth daily.     hydrOXYzine (VISTARIL) 25 MG capsule Take 3 capsules by mouth in the morning and at bedtime. ONE TABLET IN THE AM ONE IN THE PM     rizatriptan (MAXALT) 10 MG tablet Take 1 tablet (10 mg total) by mouth as needed for migraine. May repeat in 2 hours if needed 10 tablet 6   topiramate (TOPAMAX) 50 MG tablet Take 1 tablet (50 mg total) by mouth 2 (two) times daily. 180 tablet 4   triamcinolone ointment (KENALOG) 0.1 % Apply 1 application topically 2 (two) times daily. 454 g 1   No facility-administered medications prior to visit.    PAST MEDICAL HISTORY: Past Medical History:  Diagnosis Date   Hyperlipidemia    Light headedness    Migraine headache    OSA on CPAP     PAST SURGICAL HISTORY: Past Surgical History:  Procedure Laterality Date   NASAL SEPTUM SURGERY     ROTATOR CUFF REPAIR  WRIST FRACTURE SURGERY Right 08/2011    FAMILY HISTORY: Family History  Problem Relation Age of Onset   Hypertension Mother    Peripheral vascular disease Mother    Hypertension Father    Other Father        dyslipidemia   Other Sister        sepsis    SOCIAL HISTORY: Social History   Socioeconomic History   Marital status: Married    Spouse name: Vivien Rota   Number of children: 1   Years of education: Not on file   Highest education level: Bachelor's degree (e.g., BA, AB, BS)  Occupational History    Comment: industrial painting  Tobacco Use   Smoking status: Never   Smokeless tobacco: Never  Vaping Use   Vaping Use: Never used  Substance and Sexual Activity    Alcohol use: Yes    Comment: OCC   Drug use: No   Sexual activity: Not on file  Other Topics Concern   Not on file  Social History Narrative   Caffeine- diet green tea 2 a day   Social Determinants of Health   Financial Resource Strain: Not on file  Food Insecurity: Not on file  Transportation Needs: Not on file  Physical Activity: Not on file  Stress: Not on file  Social Connections: Not on file  Intimate Partner Violence: Not on file     PHYSICAL EXAM  Video visit.    DIAGNOSTIC DATA (LABS, IMAGING, TESTING) - I reviewed patient records, labs, notes, testing and imaging myself where available.  Lab Results  Component Value Date   WBC 3.8 (L) 02/27/2011   HGB 16.6 02/27/2011   HCT 47.5 02/27/2011   MCV 89.1 02/27/2011   PLT 182 02/27/2011      Component Value Date/Time   NA 139 02/27/2011 0902   K 4.9 02/27/2011 0902   CL 104 02/27/2011 0902   CO2 27 02/27/2011 0902   GLUCOSE 78 02/27/2011 0902   BUN 19 02/27/2011 0902   CREATININE 0.96 02/27/2011 0902   CALCIUM 10.1 02/27/2011 0902   PROT 7.1 02/27/2011 0902   ALBUMIN 4.2 02/27/2011 0902   AST 29 02/27/2011 0902   ALT 31 02/27/2011 0902   ALKPHOS 72 02/27/2011 0902   BILITOT 1.0 02/27/2011 0902   GFRNONAA >90 02/27/2011 0902   GFRAA >90 02/27/2011 0902   No results found for: "CHOL", "HDL", "LDLCALC", "LDLDIRECT", "TRIG", "CHOLHDL" No results found for: "HGBA1C" No results found for: "VITAMINB12" No results found for: "TSH"  03/08/21 MRI brain MRI of the brain with and without contrast shows the following: 1.   Scattered punctate T2/FLAIR in the subcortical and deep white matter of the hemispheres.  This is a nonspecific finding but is most consistent with mild age-related chronic microvascular ischemic change or the sequela of migraine headaches.  None of the foci enhanced or appear to be acute.   ASSESSMENT AND PLAN  62 y.o. year old male here with history of migraine headaches are since age 25  years old, now with new type of headache starting in 2021 with right frontal pain, nausea with standing, constant pain.  We will proceed with further work-up to rule out other secondary causes.  Also will try migraine treatments.   Dx:  1. Migraine with aura and without status migrainosus, not intractable      PLAN:     MIGRAINE TREATMENT PLAN:  MIGRAINE PREVENTION  LIFESTYLE CHANGES -Stop or avoid smoking -Decrease or avoid caffeine / alcohol -  Eat and sleep on a regular schedule -Exercise several times per week - continue topiramate 50mg  twice a day; drink plenty of water   MIGRAINE RESCUE  - ibuprofen, tylenol as needed - continue rizatriptan (Maxalt) 10mg  as needed for breakthrough headache; may repeat x 1 after 2 hours; max 2 tabs per day or 8 per month   OSA - continue CPAP   No orders of the defined types were placed in this encounter.  Meds ordered this encounter  Medications   topiramate (TOPAMAX) 50 MG tablet    Sig: Take 1 tablet (50 mg total) by mouth 2 (two) times daily.    Dispense:  180 tablet    Refill:  4   rizatriptan (MAXALT) 10 MG tablet    Sig: Take 1 tablet (10 mg total) by mouth as needed for migraine. May repeat in 2 hours if needed    Dispense:  10 tablet    Refill:  6   Return in about 1 year (around 08/14/2023) for MyChart visit (15 min), with NP.  Virtual Visit via Video Note  I connected with Bronco A Suydam on 08/14/22 at  2:15 PM EDT by a video enabled telemedicine application and verified that I am speaking with the correct person using two identifiers.   I discussed the limitations of evaluation and management by telemedicine and the availability of in person appointments. The patient expressed understanding and agreed to proceed.  Patient is at home and I am at the office.   I spent 10 minutes of face-to-face and non-face-to-face time with patient.  This included previsit chart review, lab review, study review, order entry,  electronic health record documentation, patient education.      Penni Bombard, MD [id number], [id number] PM Certified in Neurology, Neurophysiology and Neuroimaging  [facility] [address], [place], [place] [address] [phone]

## 2022-09-06 DIAGNOSIS — F411 Generalized anxiety disorder: Secondary | ICD-10-CM | POA: Diagnosis not present

## 2022-09-06 DIAGNOSIS — F331 Major depressive disorder, recurrent, moderate: Secondary | ICD-10-CM | POA: Diagnosis not present

## 2022-09-06 DIAGNOSIS — F605 Obsessive-compulsive personality disorder: Secondary | ICD-10-CM | POA: Diagnosis not present

## 2023-02-05 DIAGNOSIS — E782 Mixed hyperlipidemia: Secondary | ICD-10-CM | POA: Diagnosis not present

## 2023-02-05 DIAGNOSIS — Z0000 Encounter for general adult medical examination without abnormal findings: Secondary | ICD-10-CM | POA: Diagnosis not present

## 2023-02-05 DIAGNOSIS — Z23 Encounter for immunization: Secondary | ICD-10-CM | POA: Diagnosis not present

## 2023-02-05 DIAGNOSIS — L814 Other melanin hyperpigmentation: Secondary | ICD-10-CM | POA: Diagnosis not present

## 2023-02-05 DIAGNOSIS — Z125 Encounter for screening for malignant neoplasm of prostate: Secondary | ICD-10-CM | POA: Diagnosis not present

## 2023-02-05 DIAGNOSIS — I10 Essential (primary) hypertension: Secondary | ICD-10-CM | POA: Diagnosis not present

## 2023-02-05 DIAGNOSIS — D225 Melanocytic nevi of trunk: Secondary | ICD-10-CM | POA: Diagnosis not present

## 2023-02-05 DIAGNOSIS — E559 Vitamin D deficiency, unspecified: Secondary | ICD-10-CM | POA: Diagnosis not present

## 2023-02-05 DIAGNOSIS — L570 Actinic keratosis: Secondary | ICD-10-CM | POA: Diagnosis not present

## 2023-02-05 DIAGNOSIS — L821 Other seborrheic keratosis: Secondary | ICD-10-CM | POA: Diagnosis not present

## 2023-02-12 DIAGNOSIS — F331 Major depressive disorder, recurrent, moderate: Secondary | ICD-10-CM | POA: Diagnosis not present

## 2023-02-12 DIAGNOSIS — F5101 Primary insomnia: Secondary | ICD-10-CM | POA: Diagnosis not present

## 2023-03-07 DIAGNOSIS — D582 Other hemoglobinopathies: Secondary | ICD-10-CM | POA: Diagnosis not present

## 2023-03-07 DIAGNOSIS — F331 Major depressive disorder, recurrent, moderate: Secondary | ICD-10-CM | POA: Diagnosis not present

## 2023-03-07 DIAGNOSIS — F411 Generalized anxiety disorder: Secondary | ICD-10-CM | POA: Diagnosis not present

## 2023-03-07 DIAGNOSIS — F605 Obsessive-compulsive personality disorder: Secondary | ICD-10-CM | POA: Diagnosis not present

## 2023-03-13 DIAGNOSIS — F331 Major depressive disorder, recurrent, moderate: Secondary | ICD-10-CM | POA: Diagnosis not present

## 2023-03-13 DIAGNOSIS — F5101 Primary insomnia: Secondary | ICD-10-CM | POA: Diagnosis not present

## 2023-03-15 ENCOUNTER — Telehealth: Payer: Self-pay | Admitting: Oncology

## 2023-03-15 NOTE — Telephone Encounter (Signed)
Called and left VM for patient to call back to schedule an appointment.

## 2023-03-16 ENCOUNTER — Telehealth: Payer: Self-pay

## 2023-03-16 NOTE — Telephone Encounter (Signed)
Scheduled appointments per referral. Patient is aware of the appointment time and date as well as the address. Patient was informed to arrive 10-15 minutes prior with updated insurance information. All questions were answered.

## 2023-03-27 NOTE — Progress Notes (Unsigned)
[facility] CONSULT NOTE  Patient Care Team: Marden Noble, MD (Inactive) as PCP - General (Internal Medicine)   ASSESSMENT & PLAN 62 y.o. male with history of hyperlipidemia, obesity, OSA, migraine headaches, mild hypogonadism, BPH, erectile dysfunction referred here for thrombocytosis.  Records reviewed and showed mild polycythemia.  Platelet numbers were normal.  Patient is currently using testosterone.  He has history of OSA and his weight is over 130 kg.  Discussed DDX with Leonidus today. Will proceed with blood testing and follow up if indicated. If negative finding to suggest primary polycythemia, follow up with PCP and continues to work on secondary causes of polycythemia.  Orders Placed This Encounter  Procedures   CBC with Differential (Cancer Center Only)    Standing Status:   Future    Number of Occurrences:   1    Standing Expiration Date:   03/28/2024   Lactate dehydrogenase    Standing Status:   Future    Number of Occurrences:   1    Standing Expiration Date:   03/28/2024   CMP (Cancer Center only)    Standing Status:   Future    Number of Occurrences:   1    Standing Expiration Date:   03/28/2024   JAK2 (INCLUDING V617F AND EXON 12), MPL,& CALR W/RFL MPN PANEL (NGS)    Standing Status:   Future    Number of Occurrences:   1    Standing Expiration Date:   03/28/2024    All questions were answered. The patient knows to call the clinic with any problems, questions or concerns.  Melven Sartorius, MD 03/29/2023 10:51 PM   CHIEF COMPLAINTS/PURPOSE OF CONSULTATION:  Thrombocytosis   HISTORY OF PRESENTING ILLNESS:  Andrew Rubio 62 y.o. male is here because of elevated platelet count.  Per outside records dated  01/26/2022 hemoglobin 17.4 platelet 191 02/05/2023 WBC 4.7 hemoglobin 18 MCV 92 platelet 288 normal LFT, creatinine. 03/07/2023 WBC 4.3.  Hemoglobin 18.7 MCV 92 platelet 211  Evaluation of elevated hemoglobin:  No: Chronic pulmonary  disease Yes: Sleep apnea uses CPAP No: Massive obesity No: Congenital or chronic heart condition No: Heavy smoking/chronic carbon monoxide poisoning Yes: Use of an anabolic steroids.  Currently using testosterone. He has been on testosterone for about few years with topical cream and then injections, then daily  No: Use of diuretics lead to reduced plasma volume No: Hematuria No: History of heavy alcohol use or cirrhosis No: Pruritus after bathing, erythromelalgia, gout, thrombosis, early satiety No: Living in high [place]  MEDICAL HISTORY:  Past Medical History:  Diagnosis Date   Hyperlipidemia    Light headedness    Migraine headache    OSA on CPAP     SURGICAL HISTORY: Past Surgical History:  Procedure Laterality Date   NASAL SEPTUM SURGERY     ROTATOR CUFF REPAIR     WRIST FRACTURE SURGERY Right 08/2011    SOCIAL HISTORY: Social History   Socioeconomic History   Marital status: Married    Spouse name: Sheralyn Boatman   Number of children: 1   Years of education: Not on file   Highest education level: Bachelor's degree (e.g., BA, AB, BS)  Occupational History    Comment: industrial painting  Tobacco Use   Smoking status: Never   Smokeless tobacco: Never  Vaping Use   Vaping status: Never Used  Substance and Sexual Activity   Alcohol use: Yes    Comment: OCC   Drug use: No   Sexual  activity: Not on file  Other Topics Concern   Not on file  Social History Narrative   Caffeine- diet green tea 2 a day   Social Determinants of Health   Financial Resource Strain: Not on file  Food Insecurity: No Food Insecurity (03/29/2023)   Hunger Vital Sign    Worried About Running Out of Food in the Last Year: Never true    Ran Out of Food in the Last Year: Never true  Transportation Needs: No Transportation Needs (03/29/2023)   PRAPARE - Administrator, Civil Service (Medical): No    Lack of Transportation (Non-Medical): No  Physical Activity: Not on file  Stress:  Not on file  Social Connections: Unknown (10/11/2021)   Received from [facility], [facility]   Social Network    Social Network: Not on file  Intimate Partner Violence: Not At Risk (03/29/2023)   Humiliation, Afraid, Rape, and Kick questionnaire    Fear of Current or Ex-Partner: No    Emotionally Abused: No    Physically Abused: No    Sexually Abused: No    FAMILY HISTORY: Family History  Problem Relation Age of Onset   Hypertension Mother    Peripheral vascular disease Mother    Hypertension Father    Other Father        dyslipidemia   Other Sister        sepsis    ALLERGIES:  is allergic to zocor [simvastatin].  MEDICATIONS:  Current Outpatient Medications  Medication Sig Dispense Refill   aspirin EC 81 MG tablet Take 81 mg by mouth daily. Swallow whole.     b complex vitamins capsule Take 1 capsule by mouth daily.     Cholecalciferol (D3 2000 PO) Take by mouth daily.     desvenlafaxine (PRISTIQ) 100 MG 24 hr tablet Take 100 mg by mouth daily.     fluticasone (FLONASE) 50 MCG/ACT nasal spray Place into both nostrils daily.     Magnesium 400 MG CAPS Take by mouth daily.     meloxicam (MOBIC) 15 MG tablet TAKE ONE TABLET BY MOUTH DAILY AS NEEDED 30 tablet 0   Multiple Vitamin (MULTIVITAMIN) tablet Take 1 tablet by mouth daily.     rizatriptan (MAXALT) 10 MG tablet Take 1 tablet (10 mg total) by mouth as needed for migraine. May repeat in 2 hours if needed 10 tablet 6   rosuvastatin (CRESTOR) 20 MG tablet Take 20 mg by mouth at bedtime.     tamsulosin (FLOMAX) 0.4 MG CAPS capsule Take 0.4 mg by mouth.     topiramate (TOPAMAX) 50 MG tablet Take 1 tablet (50 mg total) by mouth 2 (two) times daily. 180 tablet 4   valsartan-hydrochlorothiazide (DIOVAN-HCT) 80-12.5 MG tablet Take 1 tablet by mouth daily.     vitamin C (ASCORBIC ACID) 500 MG tablet Take 500 mg by mouth daily.     FLUoxetine (PROZAC) 20 MG capsule Take 40 mg by mouth daily. (Patient not taking: Reported on  03/29/2023)     No current facility-administered medications for this visit.    REVIEW OF SYSTEMS:   Constitutional: Denies weight loss, decreased appetite or night sweats Eyes: Denies visual changes Respiratory: Denies cough, shortness of breath or wheezes Cardiovascular: Denies palpitation, chest discomfort or lower extremity swelling Gastrointestinal:  Denies nausea, abdominal pain with distention Skin: Denies abnormal skin rashes Lymphatics: Denies new lymphadenopathy Neurological: Denies numbness, tingling or new weaknesses   PHYSICAL EXAMINATION: ECOG PERFORMANCE STATUS: 0 - Asymptomatic  Vitals:   03/29/23 1435  BP: 138/79  Pulse: 96  Resp: 17  Temp: 97.8 F (36.6 C)  SpO2: 97%   Filed Weights   03/29/23 1435  Weight: 249 lb 14.4 oz (113.4 kg)    GENERAL: alert, no distress and comfortable SKIN: skin color normal and no bruising or petechiae on exposed skin EYES: normal, sclera clear OROPHARYNX: no exudate  NECK: No palpable mass LYMPH:  no palpable cervical, axillary or inguinal lymphadenopathy  LUNGS: clear to auscultation and percussion with normal breathing effort HEART: regular rate & rhythm and no murmurs  ABDOMEN: abdomen soft, non-tender and nondistended. Musculoskeletal: no edema NEURO: no focal motor/sensory deficits  LABORATORY DATA:  I have reviewed the data as listed Recent Results (from the past 2160 hour(s))  CMP (Cancer Center only)     Status: Abnormal   Collection Time: 03/29/23  3:14 PM  Result Value Ref Range   Sodium 137 135 - 145 mmol/L   Potassium 3.9 3.5 - 5.1 mmol/L   Chloride 105 98 - 111 mmol/L   CO2 25 22 - 32 mmol/L   Glucose, Bld 112 (H) 70 - 99 mg/dL    Comment: Glucose reference range applies only to samples taken after fasting for at least 8 hours.   BUN 19 8 - 23 mg/dL   Creatinine 1.61 0.96 - 1.24 mg/dL   Calcium 04.5 8.9 - 40.9 mg/dL   Total Protein 7.6 6.5 - 8.1 g/dL   Albumin 4.7 3.5 - 5.0 g/dL   AST 22 15 - 41  U/L   ALT 35 0 - 44 U/L   Alkaline Phosphatase 45 38 - 126 U/L   Total Bilirubin 1.2 0.3 - 1.2 mg/dL   GFR, Estimated >81 >19 mL/min    Comment: (NOTE) Calculated using the CKD-EPI Creatinine Equation (2021)    Anion gap 7 5 - 15    Comment: Performed at [facility], [address], [place], [place] [address]  Lactate dehydrogenase     Status: None   Collection Time: 03/29/23  3:14 PM  Result Value Ref Range   LDH 142 98 - 192 U/L    Comment: Performed at [facility], [address] [place], [place] [address]  CBC with Differential (Cancer Center Only)     Status: Abnormal   Collection Time: 03/29/23  3:14 PM  Result Value Ref Range   WBC Count 4.8 4.0 - 10.5 K/uL   RBC 6.03 (H) 4.22 - 5.81 MIL/uL   Hemoglobin 18.8 (H) 13.0 - 17.0 g/dL   HCT 30.8 (H) 65.7 - 84.6 %   MCV 89.7 80.0 - 100.0 fL   MCH 31.2 26.0 - 34.0 pg   MCHC 34.8 30.0 - 36.0 g/dL   RDW 96.2 95.2 - 84.1 %   Platelet Count 184 150 - 400 K/uL   nRBC 0.0 0.0 - 0.2 %   Neutrophils Relative % 61 %   Neutro Abs 2.9 1.7 - 7.7 K/uL   Lymphocytes Relative 26 %   Lymphs Abs 1.3 0.7 - 4.0 K/uL   Monocytes Relative 8 %   Monocytes Absolute 0.4 0.1 - 1.0 K/uL   Eosinophils Relative 4 %   Eosinophils Absolute 0.2 0.0 - 0.5 K/uL   Basophils Relative 1 %   Basophils Absolute 0.0 0.0 - 0.1 K/uL   Immature Granulocytes 0 %   Abs Immature Granulocytes 0.02 0.00 - 0.07 K/uL    Comment: Performed at [facility], [address] [place], [place] [address]    RADIOGRAPHIC STUDIES: I have personally reviewed the radiological images as listed and agreed with the findings in the report. No results found.

## 2023-03-29 ENCOUNTER — Inpatient Hospital Stay: Payer: BC Managed Care – PPO

## 2023-03-29 VITALS — BP 138/79 | HR 96 | Temp 97.8°F | Resp 17 | Wt 249.9 lb

## 2023-03-29 DIAGNOSIS — Z7982 Long term (current) use of aspirin: Secondary | ICD-10-CM | POA: Insufficient documentation

## 2023-03-29 DIAGNOSIS — D75839 Thrombocytosis, unspecified: Secondary | ICD-10-CM | POA: Insufficient documentation

## 2023-03-29 DIAGNOSIS — D751 Secondary polycythemia: Secondary | ICD-10-CM

## 2023-03-29 LAB — CBC WITH DIFFERENTIAL (CANCER CENTER ONLY)
Abs Immature Granulocytes: 0.02 10*3/uL (ref 0.00–0.07)
Basophils Absolute: 0 10*3/uL (ref 0.0–0.1)
Basophils Relative: 1 %
Eosinophils Absolute: 0.2 10*3/uL (ref 0.0–0.5)
Eosinophils Relative: 4 %
HCT: 54.1 % — ABNORMAL HIGH (ref 39.0–52.0)
Hemoglobin: 18.8 g/dL — ABNORMAL HIGH (ref 13.0–17.0)
Immature Granulocytes: 0 %
Lymphocytes Relative: 26 %
Lymphs Abs: 1.3 10*3/uL (ref 0.7–4.0)
MCH: 31.2 pg (ref 26.0–34.0)
MCHC: 34.8 g/dL (ref 30.0–36.0)
MCV: 89.7 fL (ref 80.0–100.0)
Monocytes Absolute: 0.4 10*3/uL (ref 0.1–1.0)
Monocytes Relative: 8 %
Neutro Abs: 2.9 10*3/uL (ref 1.7–7.7)
Neutrophils Relative %: 61 %
Platelet Count: 184 10*3/uL (ref 150–400)
RBC: 6.03 MIL/uL — ABNORMAL HIGH (ref 4.22–5.81)
RDW: 13.4 % (ref 11.5–15.5)
WBC Count: 4.8 10*3/uL (ref 4.0–10.5)
nRBC: 0 % (ref 0.0–0.2)

## 2023-03-29 LAB — CMP (CANCER CENTER ONLY)
ALT: 35 U/L (ref 0–44)
AST: 22 U/L (ref 15–41)
Albumin: 4.7 g/dL (ref 3.5–5.0)
Alkaline Phosphatase: 45 U/L (ref 38–126)
Anion gap: 7 (ref 5–15)
BUN: 19 mg/dL (ref 8–23)
CO2: 25 mmol/L (ref 22–32)
Calcium: 10.3 mg/dL (ref 8.9–10.3)
Chloride: 105 mmol/L (ref 98–111)
Creatinine: 1.15 mg/dL (ref 0.61–1.24)
GFR, Estimated: >60 mL/min (ref >60)
Glucose, Bld: 112 mg/dL — ABNORMAL HIGH (ref 70–99)
Potassium: 3.9 mmol/L (ref 3.5–5.1)
Sodium: 137 mmol/L (ref 135–145)
Total Bilirubin: 1.2 mg/dL (ref 0.3–1.2)
Total Protein: 7.6 g/dL (ref 6.5–8.1)

## 2023-03-29 LAB — LACTATE DEHYDROGENASE: LDH: 142 U/L (ref 98–192)

## 2023-04-03 LAB — JAK2 (INCLUDING V617F AND EXON 12), MPL,& CALR W/RFL MPN PANEL (NGS)

## 2023-04-05 DIAGNOSIS — R3911 Hesitancy of micturition: Secondary | ICD-10-CM | POA: Diagnosis not present

## 2023-04-05 DIAGNOSIS — R972 Elevated prostate specific antigen [PSA]: Secondary | ICD-10-CM | POA: Diagnosis not present

## 2023-04-12 DIAGNOSIS — G4733 Obstructive sleep apnea (adult) (pediatric): Secondary | ICD-10-CM | POA: Diagnosis not present

## 2023-05-12 DIAGNOSIS — G4733 Obstructive sleep apnea (adult) (pediatric): Secondary | ICD-10-CM | POA: Diagnosis not present

## 2023-05-21 DIAGNOSIS — J019 Acute sinusitis, unspecified: Secondary | ICD-10-CM | POA: Diagnosis not present

## 2023-05-21 DIAGNOSIS — J210 Acute bronchiolitis due to respiratory syncytial virus: Secondary | ICD-10-CM | POA: Diagnosis not present

## 2023-05-21 DIAGNOSIS — J069 Acute upper respiratory infection, unspecified: Secondary | ICD-10-CM | POA: Diagnosis not present

## 2023-06-01 DIAGNOSIS — E281 Androgen excess: Secondary | ICD-10-CM | POA: Diagnosis not present

## 2023-06-01 DIAGNOSIS — R972 Elevated prostate specific antigen [PSA]: Secondary | ICD-10-CM | POA: Diagnosis not present

## 2023-06-05 DIAGNOSIS — R972 Elevated prostate specific antigen [PSA]: Secondary | ICD-10-CM | POA: Diagnosis not present

## 2023-06-05 DIAGNOSIS — R350 Frequency of micturition: Secondary | ICD-10-CM | POA: Diagnosis not present

## 2023-06-12 DIAGNOSIS — G4733 Obstructive sleep apnea (adult) (pediatric): Secondary | ICD-10-CM | POA: Diagnosis not present

## 2023-06-14 ENCOUNTER — Other Ambulatory Visit: Payer: Self-pay | Admitting: Urology

## 2023-06-14 DIAGNOSIS — R972 Elevated prostate specific antigen [PSA]: Secondary | ICD-10-CM

## 2023-07-17 DIAGNOSIS — I10 Essential (primary) hypertension: Secondary | ICD-10-CM | POA: Diagnosis not present

## 2023-07-17 DIAGNOSIS — G4733 Obstructive sleep apnea (adult) (pediatric): Secondary | ICD-10-CM | POA: Diagnosis not present

## 2023-07-30 DIAGNOSIS — G4733 Obstructive sleep apnea (adult) (pediatric): Secondary | ICD-10-CM | POA: Diagnosis not present

## 2023-08-06 DIAGNOSIS — Z20828 Contact with and (suspected) exposure to other viral communicable diseases: Secondary | ICD-10-CM | POA: Diagnosis not present

## 2023-08-30 DIAGNOSIS — G4733 Obstructive sleep apnea (adult) (pediatric): Secondary | ICD-10-CM | POA: Diagnosis not present

## 2023-09-08 DIAGNOSIS — J3489 Other specified disorders of nose and nasal sinuses: Secondary | ICD-10-CM | POA: Diagnosis not present

## 2023-09-19 DIAGNOSIS — R972 Elevated prostate specific antigen [PSA]: Secondary | ICD-10-CM | POA: Diagnosis not present

## 2023-09-21 ENCOUNTER — Encounter: Payer: Self-pay | Admitting: Urology

## 2023-09-22 ENCOUNTER — Other Ambulatory Visit: Payer: Self-pay | Admitting: Diagnostic Neuroimaging

## 2023-09-24 DIAGNOSIS — R972 Elevated prostate specific antigen [PSA]: Secondary | ICD-10-CM | POA: Diagnosis not present

## 2023-09-24 DIAGNOSIS — R3915 Urgency of urination: Secondary | ICD-10-CM | POA: Diagnosis not present

## 2023-09-24 NOTE — Telephone Encounter (Signed)
 Last seen on 08/14/22 No follow up scheduled per note " Return in about 1 year (around 08/14/2023) for MyChart visit (15 min), with NP. "

## 2023-09-29 DIAGNOSIS — G4733 Obstructive sleep apnea (adult) (pediatric): Secondary | ICD-10-CM | POA: Diagnosis not present

## 2023-10-03 ENCOUNTER — Ambulatory Visit
Admission: RE | Admit: 2023-10-03 | Discharge: 2023-10-03 | Disposition: A | Discharge status: Home / Self Care | Source: Ambulatory Visit | Attending: Urology | Admitting: Urology

## 2023-10-03 DIAGNOSIS — R972 Elevated prostate specific antigen [PSA]: Secondary | ICD-10-CM

## 2023-10-03 DIAGNOSIS — N400 Enlarged prostate without lower urinary tract symptoms: Secondary | ICD-10-CM | POA: Diagnosis not present

## 2023-10-03 MED ORDER — GADOPICLENOL 0.5 MMOL/ML IV SOLN
10.0000 mL | Freq: Once | INTRAVENOUS | Status: AC | PRN
  Administered 2023-10-03: 10 mL via INTRAVENOUS

## 2023-10-10 DIAGNOSIS — L718 Other rosacea: Secondary | ICD-10-CM | POA: Diagnosis not present

## 2023-10-16 DIAGNOSIS — G4733 Obstructive sleep apnea (adult) (pediatric): Secondary | ICD-10-CM | POA: Diagnosis not present

## 2023-10-26 ENCOUNTER — Other Ambulatory Visit: Payer: Self-pay | Admitting: Diagnostic Neuroimaging

## 2023-10-30 DIAGNOSIS — G4733 Obstructive sleep apnea (adult) (pediatric): Secondary | ICD-10-CM | POA: Diagnosis not present

## 2023-11-07 DIAGNOSIS — D225 Melanocytic nevi of trunk: Secondary | ICD-10-CM | POA: Diagnosis not present

## 2023-11-07 DIAGNOSIS — L814 Other melanin hyperpigmentation: Secondary | ICD-10-CM | POA: Diagnosis not present

## 2023-11-07 DIAGNOSIS — L570 Actinic keratosis: Secondary | ICD-10-CM | POA: Diagnosis not present

## 2023-11-07 DIAGNOSIS — L2989 Other pruritus: Secondary | ICD-10-CM | POA: Diagnosis not present

## 2023-11-07 DIAGNOSIS — L821 Other seborrheic keratosis: Secondary | ICD-10-CM | POA: Diagnosis not present

## 2023-11-19 ENCOUNTER — Other Ambulatory Visit: Payer: Self-pay | Admitting: Diagnostic Neuroimaging

## 2023-11-20 DIAGNOSIS — G4733 Obstructive sleep apnea (adult) (pediatric): Secondary | ICD-10-CM | POA: Diagnosis not present

## 2023-11-29 DIAGNOSIS — G4733 Obstructive sleep apnea (adult) (pediatric): Secondary | ICD-10-CM | POA: Diagnosis not present

## 2023-12-03 ENCOUNTER — Other Ambulatory Visit: Payer: Self-pay | Admitting: Diagnostic Neuroimaging

## 2023-12-20 DIAGNOSIS — G4733 Obstructive sleep apnea (adult) (pediatric): Secondary | ICD-10-CM | POA: Diagnosis not present

## 2023-12-30 DIAGNOSIS — G4733 Obstructive sleep apnea (adult) (pediatric): Secondary | ICD-10-CM | POA: Diagnosis not present

## 2024-01-07 DIAGNOSIS — R972 Elevated prostate specific antigen [PSA]: Secondary | ICD-10-CM | POA: Diagnosis not present

## 2024-01-15 DIAGNOSIS — R972 Elevated prostate specific antigen [PSA]: Secondary | ICD-10-CM | POA: Diagnosis not present

## 2024-01-20 DIAGNOSIS — G4733 Obstructive sleep apnea (adult) (pediatric): Secondary | ICD-10-CM | POA: Diagnosis not present

## 2024-01-24 DIAGNOSIS — C61 Malignant neoplasm of prostate: Secondary | ICD-10-CM | POA: Diagnosis not present

## 2024-01-31 DIAGNOSIS — C61 Malignant neoplasm of prostate: Secondary | ICD-10-CM | POA: Diagnosis not present

## 2024-02-05 DIAGNOSIS — B078 Other viral warts: Secondary | ICD-10-CM | POA: Diagnosis not present

## 2024-02-05 DIAGNOSIS — L209 Atopic dermatitis, unspecified: Secondary | ICD-10-CM | POA: Diagnosis not present

## 2024-02-05 DIAGNOSIS — L738 Other specified follicular disorders: Secondary | ICD-10-CM | POA: Diagnosis not present

## 2024-02-05 DIAGNOSIS — L820 Inflamed seborrheic keratosis: Secondary | ICD-10-CM | POA: Diagnosis not present

## 2024-02-06 DIAGNOSIS — Z23 Encounter for immunization: Secondary | ICD-10-CM | POA: Diagnosis not present

## 2024-02-06 DIAGNOSIS — N401 Enlarged prostate with lower urinary tract symptoms: Secondary | ICD-10-CM | POA: Diagnosis not present

## 2024-02-06 DIAGNOSIS — E782 Mixed hyperlipidemia: Secondary | ICD-10-CM | POA: Diagnosis not present

## 2024-02-06 DIAGNOSIS — G4733 Obstructive sleep apnea (adult) (pediatric): Secondary | ICD-10-CM | POA: Diagnosis not present

## 2024-02-06 DIAGNOSIS — E559 Vitamin D deficiency, unspecified: Secondary | ICD-10-CM | POA: Diagnosis not present

## 2024-02-06 DIAGNOSIS — C61 Malignant neoplasm of prostate: Secondary | ICD-10-CM | POA: Diagnosis not present

## 2024-02-06 DIAGNOSIS — I10 Essential (primary) hypertension: Secondary | ICD-10-CM | POA: Diagnosis not present

## 2024-02-06 DIAGNOSIS — Z0000 Encounter for general adult medical examination without abnormal findings: Secondary | ICD-10-CM | POA: Diagnosis not present

## 2024-04-08 DIAGNOSIS — N3941 Urge incontinence: Secondary | ICD-10-CM | POA: Diagnosis not present

## 2024-04-09 DIAGNOSIS — E782 Mixed hyperlipidemia: Secondary | ICD-10-CM | POA: Diagnosis not present
